# Patient Record
Sex: Male | Born: 1955 | Race: White | Hispanic: No | Smoking: Never smoker
Health system: Southern US, Community
[De-identification: ages and names within clinical notes are randomized; demographics above are authoritative.]

## PROBLEM LIST (undated history)

## (undated) DIAGNOSIS — I2510 Atherosclerotic heart disease of native coronary artery without angina pectoris: Secondary | ICD-10-CM

## (undated) DIAGNOSIS — F419 Anxiety disorder, unspecified: Secondary | ICD-10-CM

## (undated) DIAGNOSIS — Z8669 Personal history of other diseases of the nervous system and sense organs: Secondary | ICD-10-CM

## (undated) DIAGNOSIS — R011 Cardiac murmur, unspecified: Secondary | ICD-10-CM

## (undated) DIAGNOSIS — I499 Cardiac arrhythmia, unspecified: Secondary | ICD-10-CM

## (undated) DIAGNOSIS — G43909 Migraine, unspecified, not intractable, without status migrainosus: Secondary | ICD-10-CM

## (undated) DIAGNOSIS — K449 Diaphragmatic hernia without obstruction or gangrene: Secondary | ICD-10-CM

## (undated) DIAGNOSIS — M1990 Unspecified osteoarthritis, unspecified site: Secondary | ICD-10-CM

## (undated) DIAGNOSIS — I10 Essential (primary) hypertension: Secondary | ICD-10-CM

## (undated) HISTORY — PX: GANGLION CYST EXCISION: SHX1691

## (undated) HISTORY — DX: Essential (primary) hypertension: I10

## (undated) HISTORY — DX: Cardiac murmur, unspecified: R01.1

## (undated) HISTORY — DX: Personal history of other diseases of the nervous system and sense organs: Z86.69

## (undated) HISTORY — PX: COLONOSCOPY: SHX174

## (undated) HISTORY — DX: Diaphragmatic hernia without obstruction or gangrene: K44.9

## (undated) HISTORY — DX: Migraine, unspecified, not intractable, without status migrainosus: G43.909

---

## 2004-09-14 HISTORY — PX: OTHER SURGICAL HISTORY: SHX169

## 2004-09-14 LAB — HM COLONOSCOPY: HM Colonoscopy: NORMAL

## 2008-09-02 ENCOUNTER — Emergency Department (HOSPITAL_COMMUNITY): Admission: EM | Admit: 2008-09-02 | Discharge: 2008-09-02 | Payer: Self-pay | Admitting: Emergency Medicine

## 2009-04-30 ENCOUNTER — Ambulatory Visit: Payer: Self-pay | Admitting: Family Medicine

## 2009-05-27 ENCOUNTER — Ambulatory Visit: Payer: Self-pay | Admitting: Family Medicine

## 2010-03-13 ENCOUNTER — Ambulatory Visit: Payer: Self-pay | Admitting: Family Medicine

## 2010-06-30 ENCOUNTER — Ambulatory Visit: Payer: Self-pay | Admitting: Family Medicine

## 2010-07-24 ENCOUNTER — Ambulatory Visit: Payer: Self-pay | Admitting: Family Medicine

## 2010-08-22 ENCOUNTER — Ambulatory Visit: Payer: Self-pay | Admitting: Family Medicine

## 2011-01-20 ENCOUNTER — Other Ambulatory Visit: Payer: Self-pay | Admitting: Family Medicine

## 2011-01-20 MED ORDER — BUPROPION HCL ER (XL) 150 MG PO TB24: 150.0000 mg | ORAL_TABLET | ORAL | Status: DC

## 2011-04-02 ENCOUNTER — Encounter: Payer: Self-pay | Admitting: Family Medicine

## 2011-04-08 ENCOUNTER — Other Ambulatory Visit: Payer: Self-pay

## 2011-04-08 MED ORDER — MOEXIPRIL-HYDROCHLOROTHIAZIDE 7.5-12.5 MG PO TABS: 1.0000 | ORAL_TABLET | Freq: Every day | ORAL | Status: DC

## 2011-05-03 ENCOUNTER — Other Ambulatory Visit: Payer: Self-pay | Admitting: Family Medicine

## 2011-05-08 ENCOUNTER — Telehealth: Payer: Self-pay | Admitting: Family Medicine

## 2011-05-08 NOTE — Telephone Encounter (Signed)
LEFT MESSAGE

## 2011-05-15 ENCOUNTER — Ambulatory Visit (INDEPENDENT_AMBULATORY_CARE_PROVIDER_SITE_OTHER): Payer: BC Managed Care – PPO | Admitting: Medical

## 2011-05-15 ENCOUNTER — Encounter: Payer: Self-pay | Admitting: Medical

## 2011-05-15 VITALS — BP 122/88 | HR 80 | Temp 98.4°F | Resp 20 | Ht 67.0 in | Wt 177.0 lb

## 2011-05-15 DIAGNOSIS — R05 Cough: Secondary | ICD-10-CM

## 2011-05-15 DIAGNOSIS — J029 Acute pharyngitis, unspecified: Secondary | ICD-10-CM

## 2011-05-15 MED ORDER — AMOXICILLIN 875 MG PO TABS: 875.0000 mg | ORAL_TABLET | Freq: Two times a day (BID) | ORAL | Status: AC

## 2011-05-15 NOTE — Progress Notes (Signed)
Subjective:     Rodney Wade is a 55 y.o. male who presents for evaluation of sore throat. Associated symptoms include dry cough, sinus and nasal congestion and sore throat. Onset of symptoms was 2 days ago, and have been gradually worsening since that time. He is drinking plenty of fluids. He has not had a recent close exposure to someone with proven streptococcal pharyngitis. He notes in the past that he gets strep 2-3 times yearly, occasionally a bronchitis, and amoxicillin usually helps.  He thinks he needs an antibiotic.  Will be on vacation next week and really wants this cleared up.   The following portions of the patient's history were reviewed and updated as appropriate: allergies, current medications, past family history, past medical history, past social history, past surgical history and problem list.  Past Medical History  Diagnosis Date  . Hypertension   . Gout   . HH (hiatus hernia)   . History of benign essential tremor   . Migraine headache     Review of Systems Constitutional: denies fever, chills, sweats Allergy: denies recent sneezing, itching Dermatology: denies rash ENT: no runny nose, ear pain, sinus pain, teeth pain Cardiology: denies chest pain, palpitations Respiratory: denies shortness of breath, wheezing,  Gastroenterology: denies abdominal pain, nausea, vomiting, diarrhea Musculoskeletal: denies arthralgias, myalgias, joint swelling, back pain, neck pain Ophthalmology: denies eye redness, itching, discharge    Objective:      Filed Vitals:   05/15/11 1345  BP: 122/88  Pulse: 80  Temp: 98.4 F (36.9 C)  Resp: 20    General appearance: no distress, WD/WN, mildly ill-appearing HEENT: normocephalic, conjunctiva/corneas normal, sclerae anicteric, nares patent, no discharge or erythema, pharynx with erythema, no exudate.  Oral cavity: MMM, no lesions  Neck: supple, no lymphadenopathy, no thyromegaly Heart: RRR, normal S1, S2, no  murmurs Lungs: CTA bilaterally, no wheezes, rhonchi, or rales  Laboratory Strep test done. Results:negative.    Assessment:   Encounter Diagnoses  Name Primary?  . Pharyngitis Yes  . Cough      Plan:    Advised that symptoms and exam suggest a viral etiology.  Discussed symptomatic treatment including salt water gargles, warm fluids, rest, hydrate well, can use over-the-counter Tylenol for throat pain, fever, or malaise.  He does have a cough.  Discussed signs of sinusitis, bronchitis, or worsening infection.  Advised if fever over 101 or much worse over weekend, gave script for Amoxicillin.  Given his hx/o prior multi streps per year, will send for throat culture.  If worse or not improving within 2-3 days, call or return.

## 2011-05-17 LAB — CULTURE, GROUP A STREP: Organism ID, Bacteria: NORMAL

## 2011-05-19 ENCOUNTER — Telehealth: Payer: Self-pay | Admitting: *Deleted

## 2011-05-19 NOTE — Telephone Encounter (Addendum)
Message copied by Dorthula Perfect on Tue May 19, 2011  8:05 AM ------      Message from: Jac Canavan      Created: Tue May 19, 2011  6:08 AM       Throat culture normal.  See if he is doing better.  Pt notified of throat culture.  Pt stated that he is doing better.  CM, LPN

## 2011-06-13 ENCOUNTER — Other Ambulatory Visit: Payer: Self-pay | Admitting: Family Medicine

## 2011-06-15 NOTE — Telephone Encounter (Signed)
Is this ok?

## 2011-09-13 ENCOUNTER — Other Ambulatory Visit: Payer: Self-pay | Admitting: Family Medicine

## 2011-09-14 NOTE — Telephone Encounter (Signed)
Is this ok to refill?  

## 2011-09-15 NOTE — Telephone Encounter (Signed)
He needs an appt 

## 2011-09-16 NOTE — Telephone Encounter (Signed)
Called and no answer.

## 2011-09-18 NOTE — Telephone Encounter (Signed)
Called no answer

## 2011-09-21 ENCOUNTER — Telehealth: Payer: Self-pay | Admitting: Internal Medicine

## 2011-09-21 ENCOUNTER — Encounter: Payer: Self-pay | Admitting: Family Medicine

## 2011-09-21 ENCOUNTER — Ambulatory Visit (INDEPENDENT_AMBULATORY_CARE_PROVIDER_SITE_OTHER): Payer: BC Managed Care – PPO | Admitting: Family Medicine

## 2011-09-21 VITALS — BP 116/76 | HR 85 | Ht 67.0 in | Wt 186.0 lb

## 2011-09-21 DIAGNOSIS — B351 Tinea unguium: Secondary | ICD-10-CM

## 2011-09-21 DIAGNOSIS — Z8739 Personal history of other diseases of the musculoskeletal system and connective tissue: Secondary | ICD-10-CM

## 2011-09-21 DIAGNOSIS — Z862 Personal history of diseases of the blood and blood-forming organs and certain disorders involving the immune mechanism: Secondary | ICD-10-CM

## 2011-09-21 DIAGNOSIS — F341 Dysthymic disorder: Secondary | ICD-10-CM

## 2011-09-21 DIAGNOSIS — I10 Essential (primary) hypertension: Secondary | ICD-10-CM

## 2011-09-21 DIAGNOSIS — Z23 Encounter for immunization: Secondary | ICD-10-CM

## 2011-09-21 DIAGNOSIS — Z8669 Personal history of other diseases of the nervous system and sense organs: Secondary | ICD-10-CM | POA: Insufficient documentation

## 2011-09-21 DIAGNOSIS — Z0000 Encounter for general adult medical examination without abnormal findings: Secondary | ICD-10-CM

## 2011-09-21 LAB — COMPREHENSIVE METABOLIC PANEL
AST: 23 U/L (ref 0–37)
Albumin: 4.6 g/dL (ref 3.5–5.2)
Alkaline Phosphatase: 43 U/L (ref 39–117)
Glucose, Bld: 93 mg/dL (ref 70–99)
Potassium: 4.6 mEq/L (ref 3.5–5.3)
Sodium: 142 mEq/L (ref 135–145)
Total Protein: 7 g/dL (ref 6.0–8.3)

## 2011-09-21 LAB — LIPID PANEL: LDL Cholesterol: 102 mg/dL — ABNORMAL HIGH (ref 0–99)

## 2011-09-21 MED ORDER — MOEXIPRIL-HYDROCHLOROTHIAZIDE 7.5-12.5 MG PO TABS: 1.0000 | ORAL_TABLET | Freq: Every day | ORAL | Status: DC

## 2011-09-21 MED ORDER — BUPROPION HCL ER (XL) 300 MG PO TB24: 300.0000 mg | ORAL_TABLET | Freq: Every day | ORAL | Status: DC

## 2011-09-21 MED ORDER — TERBINAFINE HCL 250 MG PO TABS: 250.0000 mg | ORAL_TABLET | Freq: Every day | ORAL | Status: AC

## 2011-09-21 MED ORDER — TERBINAFINE HCL 250 MG PO TABS: 250.0000 mg | ORAL_TABLET | Freq: Every day | ORAL | Status: DC

## 2011-09-21 NOTE — Telephone Encounter (Signed)
Sent new rx to [facility]

## 2011-09-21 NOTE — Progress Notes (Signed)
Subjective:    Patient ID: Rodney Wade, male    DOB: Jun 02, 1956, 56 y.o.   MRN: [id number]  HPI He is here for complete examination. He continues on Wellbutrin and is not sure whether it is working. He is ambivalent as to whether to stop it or possibly increase it. His work remains the main stressor in his life. He is single and presently not dating anyone. Would like medication for the fungal infection in his toes. He has not had a migraine headache in 2 years. He has not had a gout attack in quite some time.   Review of Systems  Constitutional: Negative.   HENT: Negative.   Eyes: Negative.   Respiratory: Negative.   Cardiovascular: Negative.   Gastrointestinal: Negative.   Genitourinary: Negative.   Musculoskeletal: Negative.   Skin: Negative.   Neurological: Negative.   Psychiatric/Behavioral: Positive for dysphoric mood.       Objective:   Physical Exam BP 116/76  Pulse 85  Ht 5\' 7"  (1.702 m)  Wt 186 lb (84.369 kg)  BMI 29.13 kg/m2  General Appearance:    Alert, cooperative, no distress, appears stated age  Head:    Normocephalic, without obvious abnormality, atraumatic  Eyes:    PERRL, conjunctiva/corneas clear, EOM's intact, fundi    benign  Ears:    Normal TM's and external ear canals  Nose:   Nares normal, mucosa normal, no drainage or sinus   tenderness  Throat:   Lips, mucosa, and tongue normal; teeth and gums normal  Neck:   Supple, no lymphadenopathy;  thyroid:  no   enlargement/tenderness/nodules; no carotid   bruit or JVD  Back:    Spine nontender, no curvature, ROM normal, no CVA     tenderness  Lungs:     Clear to auscultation bilaterally without wheezes, rales or     ronchi; respirations unlabored  Chest Wall:    No tenderness or deformity   Heart:    Regular rate and rhythm, S1 and S2 normal, no murmur, rub   or gallop  Breast Exam:    No chest wall tenderness, masses or gynecomastia  Abdomen:     Soft, non-tender, nondistended, normoactive  bowel sounds,    no masses, no hepatosplenomegaly  Genitalia:    Normal male external genitalia without lesions.  Testicles without masses.  No inguinal hernias.  Rectal:    Normal sphincter tone, no masses or tenderness; guaiac negative stool.  Prostate smooth, no nodules, not enlarged.  Extremities:   No clubbing, cyanosis or edema.thinning of several of his toenails is noted.   Pulses:   2+ and symmetric all extremities  Skin:   Skin color, texture, turgor normal, no rashes or lesions  Lymph nodes:   Cervical, supraclavicular, and axillary nodes normal  Neurologic:   CNII-XII intact, normal strength, sensation and gait; reflexes 2+ and symmetric throughout          Psych:   Normal mood, affect, hygiene and grooming.           Assessment & Plan:   1. Tinea unguium    2. Hypertension    3. Dysthymia    4. Routine general medical examination at a health care facility  CBC with Differential, Comprehensive metabolic panel, Lipid panel  5. History of gout    6. History of migraine headaches     he will continue on his present medications. Lamisil was called in. He'll return here in 2 months for repeat blood work. I  will also increase his Wellbutrin to 300 mg per day and see if this will help with the symptoms. He sought also given

## 2011-09-21 NOTE — Patient Instructions (Signed)
You will need to come back in several months for repeat blood work to followup on the Lamisil. Also let me know how the increased dose of Wellbutrin is working.

## 2011-09-22 LAB — CBC WITH DIFFERENTIAL/PLATELET
Basophils Absolute: 0 10*3/uL (ref 0.0–0.1)
Basophils Relative: 1 % (ref 0–1)
Hemoglobin: 15.2 g/dL (ref 13.0–17.0)
Lymphocytes Relative: 39 % (ref 12–46)
MCHC: 33 g/dL (ref 30.0–36.0)
Monocytes Relative: 12 % (ref 3–12)
Neutro Abs: 1.9 10*3/uL (ref 1.7–7.7)
Neutrophils Relative %: 39 % — ABNORMAL LOW (ref 43–77)
WBC: 4.9 10*3/uL (ref 4.0–10.5)

## 2011-10-20 ENCOUNTER — Ambulatory Visit (INDEPENDENT_AMBULATORY_CARE_PROVIDER_SITE_OTHER): Payer: BC Managed Care – PPO | Admitting: Family Medicine

## 2011-10-20 ENCOUNTER — Encounter: Payer: Self-pay | Admitting: Family Medicine

## 2011-10-20 VITALS — BP 140/78 | HR 89 | Temp 98.0°F | Ht 66.0 in | Wt 182.0 lb

## 2011-10-20 DIAGNOSIS — J111 Influenza due to unidentified influenza virus with other respiratory manifestations: Secondary | ICD-10-CM

## 2011-10-20 MED ORDER — CLARITHROMYCIN 500 MG PO TABS: 500.0000 mg | ORAL_TABLET | Freq: Two times a day (BID) | ORAL | Status: AC

## 2011-10-20 NOTE — Patient Instructions (Signed)
NyQuil can help with coughing at night and during the day he can use Robitussin-DM or Delsym

## 2011-10-20 NOTE — Progress Notes (Signed)
  Subjective:    Patient ID: Rodney Wade, male    DOB: 01-Aug-1956, 56 y.o.   MRN: [id number]  HPI Proximal pulley 2 weeks ago he had the onset of fever, nausea, vomiting and chills. This cleared fairly quickly and then he developed a dry hacking cough that has been intermittent since then. No fever, chills or earache slight sore throat. He continues on medications listed in the chart. He does not smoke.  Review of Systems     Objective:   Physical Exam alert and in no distress. Tympanic membranes and canals are normal. Throat is clear. Tonsils are normal. Neck is supple without adenopathy or thyromegaly. Cardiac exam shows a regular sinus rhythm without murmurs or gallops. Lungs are clear to auscultation.        Assessment & Plan:   1. Bronchitis with flu    symptoms sound atypical and I will therefore give him Biaxin. He is to call if not entirely better.

## 2011-11-19 ENCOUNTER — Encounter: Payer: Self-pay | Admitting: Family Medicine

## 2011-11-19 ENCOUNTER — Ambulatory Visit (INDEPENDENT_AMBULATORY_CARE_PROVIDER_SITE_OTHER): Payer: BC Managed Care – PPO | Admitting: Family Medicine

## 2011-11-19 VITALS — BP 126/80 | HR 83 | Wt 186.0 lb

## 2011-11-19 DIAGNOSIS — B351 Tinea unguium: Secondary | ICD-10-CM

## 2011-11-19 DIAGNOSIS — Z79899 Other long term (current) drug therapy: Secondary | ICD-10-CM

## 2011-11-19 DIAGNOSIS — F341 Dysthymic disorder: Secondary | ICD-10-CM

## 2011-11-19 NOTE — Progress Notes (Signed)
  Subjective:    Patient ID: Rodney Wade, male    DOB: Dec 05, 1955, 56 y.o.   MRN: [id number]  HPI Since last being seen, he cut his well being turned on 150 mg. 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 He would like to stay on this medication and possibly stop this summer. He states that the work and personal stress have diminished. He has continued on the Lamisil and is having no difficulty with this.   Review of Systems     Objective:   Physical Exam Alert and in no distress. Exam of his toes do show improvement at the base of the nails.       Assessment & Plan:   1. Onychomycosis  Comprehensive metabolic panel  2. Dysthymia    3. Encounter for long-term (current) use of other medications  Comprehensive metabolic panel   He will continue on Wellbutrin and possibly stop at this summer. Continue on Lamisil and call in several months if any questions.

## 2011-11-19 NOTE — Patient Instructions (Signed)
Come on back in about 4 months and we will reevaluate your toenail fungal infection

## 2011-11-20 ENCOUNTER — Telehealth: Payer: Self-pay

## 2011-11-20 LAB — COMPREHENSIVE METABOLIC PANEL
ALT: 22 U/L (ref 0–53)
CO2: 25 mEq/L (ref 19–32)
Calcium: 9.8 mg/dL (ref 8.4–10.5)
Chloride: 101 mEq/L (ref 96–112)
Sodium: 139 mEq/L (ref 135–145)
Total Protein: 6.9 g/dL (ref 6.0–8.3)

## 2011-11-20 NOTE — Progress Notes (Signed)
Quick Note:  The blood work is normal ______ 

## 2011-11-20 NOTE — Telephone Encounter (Signed)
Pt informed of labs

## 2011-12-04 ENCOUNTER — Telehealth: Payer: Self-pay | Admitting: Internal Medicine

## 2011-12-04 MED ORDER — BUPROPION HCL ER (XL) 300 MG PO TB24: 300.0000 mg | ORAL_TABLET | Freq: Every day | ORAL | Status: DC

## 2011-12-04 NOTE — Telephone Encounter (Signed)
Wellbutrin called in

## 2011-12-04 NOTE — Telephone Encounter (Signed)
IS THIS OK 

## 2012-03-25 ENCOUNTER — Ambulatory Visit: Payer: BC Managed Care – PPO | Admitting: Family Medicine

## 2012-04-04 ENCOUNTER — Encounter: Payer: Self-pay | Admitting: Family Medicine

## 2012-04-04 ENCOUNTER — Ambulatory Visit (INDEPENDENT_AMBULATORY_CARE_PROVIDER_SITE_OTHER): Payer: BC Managed Care – PPO | Admitting: Family Medicine

## 2012-04-04 VITALS — BP 126/80 | HR 90 | Wt 197.0 lb

## 2012-04-04 DIAGNOSIS — E669 Obesity, unspecified: Secondary | ICD-10-CM | POA: Insufficient documentation

## 2012-04-04 DIAGNOSIS — F341 Dysthymic disorder: Secondary | ICD-10-CM

## 2012-04-04 DIAGNOSIS — I10 Essential (primary) hypertension: Secondary | ICD-10-CM

## 2012-04-04 NOTE — Progress Notes (Signed)
  Subjective:    Patient ID: Rodney Wade, male    DOB: 1956-02-05, 56 y.o.   MRN: [id number]  HPI He is here for a recheck. He did stop his Wellbutrin stating that he was doing very well and did not think he needed it. He tapered off the medication and has been off for several months. The stresses that he is under from work are relatively stable. 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 He also has concerns over his weight and asked about weight loss pills. Continues on the blood pressure medication. He does keep himself active socially.   Review of Systems     Objective:   Physical Exam Alert and in no distress with appropriate affect otherwise not examined       Assessment & Plan:   1. Dysthymia   2. Hypertension   3. Obesity (BMI 30-39.9)    encouraged him to followup on the potential new job. We discussed weight loss. Recommended he try to get down to a waist size of 32-34. Discussed briefly the use of pills and recommended against this.

## 2012-04-30 ENCOUNTER — Ambulatory Visit (INDEPENDENT_AMBULATORY_CARE_PROVIDER_SITE_OTHER): Payer: BC Managed Care – PPO | Admitting: Internal Medicine

## 2012-04-30 VITALS — BP 115/85 | HR 78 | Temp 98.0°F | Resp 17 | Ht 67.0 in | Wt 189.0 lb

## 2012-04-30 DIAGNOSIS — S1093XA Contusion of unspecified part of neck, initial encounter: Secondary | ICD-10-CM

## 2012-04-30 DIAGNOSIS — S0093XA Contusion of unspecified part of head, initial encounter: Secondary | ICD-10-CM

## 2012-04-30 DIAGNOSIS — S0100XA Unspecified open wound of scalp, initial encounter: Secondary | ICD-10-CM

## 2012-04-30 NOTE — Progress Notes (Signed)
  Subjective:    Patient ID: Rodney Wade, male    DOB: Jul 17, 1956, 56 y.o.   MRN: [id number]  HPI [place] , hit scalp on roof, scraped skin of and needs repair. TD utd No loc, does have ha but mild.   Review of Systems     Objective:   Physical Exam Wound on scalp 2x3 cm Neuro intact. Will repair with Ms. Dolphus Jenny      Assessment & Plan:  Wound and head care

## 2012-04-30 NOTE — Patient Instructions (Signed)
Head Injury, Adult You have had a head injury that does not appear serious at this time. A concussion is a state of changed mental ability, usually from a blow to the head. You should take clear liquids for the rest of the day and then resume your regular diet. You should not take sedatives or alcoholic beverages for as long as directed by your caregiver after discharge. After injuries such as yours, most problems occur within the first 24 hours. SYMPTOMS These minor symptoms may be experienced after discharge:  Memory difficulties.   Dizziness.   Headaches.   Double vision.   Hearing difficulties.   Depression.   Tiredness.   Weakness.   Difficulty with concentration.  If you experience any of these problems, you should not be alarmed. A concussion requires a few days for recovery. Many patients with head injuries frequently experience such symptoms. Usually, these problems disappear without medical care. If symptoms last for more than one day, notify your caregiver. See your caregiver sooner if symptoms are becoming worse rather than better. HOME CARE INSTRUCTIONS   During the next 24 hours you must stay with someone who can watch you for the warning signs listed below.  Although it is unlikely that serious side effects will occur, you should be aware of signs and symptoms which may necessitate your return to this location. Side effects may occur up to 7 - 10 days following the injury. It is important for you to carefully monitor your condition and contact your caregiver or seek immediate medical attention if there is a change in your condition. SEEK IMMEDIATE MEDICAL CARE IF:   There is confusion or drowsiness.   You can not awaken the injured person.   There is nausea (feeling sick to your stomach) or continued, forceful vomiting.   You notice dizziness or unsteadiness which is getting worse, or inability to walk.   You have convulsions or unconsciousness.   You experience  severe, persistent headaches not relieved by over-the-counter or prescription medicines for pain. (Do not take aspirin as this impairs clotting abilities). Take other pain medications only as directed.   You can not use arms or legs normally.   There is clear or bloody discharge from the nose or ears.  MAKE SURE YOU:   Understand these instructions.   Will watch your condition.   Will get help right away if you are not doing well or get worse.  Document Released: 08/31/2005 Document Revised: 08/20/2011 Document Reviewed: 07/19/2009 [facility] Patient Information 2012 [place], [place].Wound Care Wound care helps prevent pain and infection.  You may need a tetanus shot if:  You cannot remember when you had your last tetanus shot.   You have never had a tetanus shot.   The injury broke your skin.  If you need a tetanus shot and you choose not to have one, you may get tetanus. Sickness from tetanus can be serious. HOME CARE   Only take medicine as told by your doctor.   Clean the wound daily with mild soap and water.   Change any bandages (dressings) as told by your doctor.   Put medicated cream and a bandage on the wound as told by your doctor.   Change the bandage if it gets wet, dirty, or starts to smell.   Take showers. Do not take baths, swim, or do anything that puts your wound under water.   Rest and raise (elevate) the wound until the pain and puffiness (swelling) are better.  Keep all doctor visits as told.  GET HELP RIGHT AWAY IF:   Yellowish-white fluid (pus) comes from the wound.   Medicine does not lessen your pain.   There is a red streak going away from the wound.   You cannot move your finger or toe.   You have a fever.  MAKE SURE YOU:   Understand these instructions.   Will watch your condition.   Will get help right away if you are not doing well or get worse.  Document Released: 06/09/2008 Document Revised: 08/20/2011 Document Reviewed:  01/04/2011 [facility] Patient Information 2012 [place], [place].

## 2012-04-30 NOTE — Progress Notes (Signed)
  Subjective:    Patient ID: Rodney Wade, male    DOB: 1955-10-19, 56 y.o.   MRN: [id number]  HPI    Review of Systems     Objective:   Physical Exam  Cleansed with betadine and sterile water. Debrided exposed dermis.  Remaining epidermis-debrided shallow edges and remaining vascularized epidermis is displayed across the wound. Xeroform placed over entire wound. Bandaged.       Assessment & Plan:  See me after 5pm Tuesday Wound care h/o

## 2012-05-03 ENCOUNTER — Encounter: Payer: Self-pay | Admitting: Physician Assistant

## 2012-05-03 ENCOUNTER — Ambulatory Visit (INDEPENDENT_AMBULATORY_CARE_PROVIDER_SITE_OTHER): Payer: BC Managed Care – PPO | Admitting: Physician Assistant

## 2012-05-03 VITALS — HR 83 | Temp 98.5°F | Resp 17 | Ht 67.0 in | Wt 193.0 lb

## 2012-05-03 DIAGNOSIS — S0190XA Unspecified open wound of unspecified part of head, initial encounter: Secondary | ICD-10-CM

## 2012-05-03 NOTE — Progress Notes (Signed)
  Subjective:    Patient ID: Rodney Wade, male    DOB: 01-25-1956, 56 y.o.   MRN: [id number]  HPI Here for scalp recheck. Doing well.  A little tender.  Review of Systems  All other systems reviewed and are negative.       Objective:   Physical Exam  Nursing note and vitals reviewed. Constitutional: He is oriented to person, place, and time. He appears well-developed and well-nourished.  Neurological: He is alert and oriented to person, place, and time.  Skin:       Scalp-xeroform is in place.  Scabbing beneath.  No sign of infection.          Assessment & Plan:  Wound-head-continue with wound care.  Soak xeroform off in 2 weeks if it is still in place.

## 2012-05-14 ENCOUNTER — Other Ambulatory Visit: Payer: Self-pay | Admitting: Family Medicine

## 2012-11-22 ENCOUNTER — Other Ambulatory Visit: Payer: Self-pay | Admitting: Family Medicine

## 2012-12-13 ENCOUNTER — Ambulatory Visit (INDEPENDENT_AMBULATORY_CARE_PROVIDER_SITE_OTHER): Payer: BC Managed Care – PPO | Admitting: Family Medicine

## 2012-12-13 VITALS — BP 130/76 | HR 86 | Temp 98.2°F

## 2012-12-13 DIAGNOSIS — M6580 Other synovitis and tenosynovitis, unspecified site: Secondary | ICD-10-CM

## 2012-12-13 DIAGNOSIS — M659 Synovitis and tenosynovitis, unspecified: Secondary | ICD-10-CM

## 2012-12-13 NOTE — Patient Instructions (Signed)
Take 4 Advil 3 times per day regularly to help with this pain. If the symptoms get worse, he start running a fever, call me

## 2012-12-13 NOTE — Progress Notes (Signed)
  Subjective:    Patient ID: Rodney Wade, male    DOB: 29-Jan-1956, 57 y.o.   MRN: [id number]  HPI He woke up early this morning complaining of fever, chills, myalgias. He took one Advil and went back to bed. When he woke up again his fever and chills had diminished however he is complaining of left hip pain. Denies sore throat, earache, cough or congestion. He does complain of left hip knee and ankle discomfort. No other joints are involved. He has had no recent STD exposure or recent injuries   Review of Systems     Objective:   Physical Exam alert and in no distress. Tympanic membranes and canals are normal. Throat is clear. Tonsils are normal. Neck is supple without adenopathy or thyromegaly. Cardiac exam shows a regular sinus rhythm without murmurs or gallops. Lungs are clear to auscultation. Pain on motion of the hip in all directions. No tenderness to palpation over the greater trochanter. No swelling of the fingers, wrists, elbows, knees or ankles is noted.       Assessment & Plan:  Synovitis of hip there really isn't any evidence of systemic infection or septic joint. I will treat conservatively with any NSAID and he will keep in touch with me if his symptoms worsen.

## 2012-12-27 ENCOUNTER — Ambulatory Visit (INDEPENDENT_AMBULATORY_CARE_PROVIDER_SITE_OTHER): Payer: BC Managed Care – PPO | Admitting: Family Medicine

## 2012-12-27 ENCOUNTER — Encounter: Payer: Self-pay | Admitting: Family Medicine

## 2012-12-27 VITALS — BP 120/78 | HR 72 | Ht 67.0 in | Wt 195.0 lb

## 2012-12-27 DIAGNOSIS — Z8639 Personal history of other endocrine, nutritional and metabolic disease: Secondary | ICD-10-CM

## 2012-12-27 DIAGNOSIS — Z0000 Encounter for general adult medical examination without abnormal findings: Secondary | ICD-10-CM

## 2012-12-27 DIAGNOSIS — Z8739 Personal history of other diseases of the musculoskeletal system and connective tissue: Secondary | ICD-10-CM

## 2012-12-27 DIAGNOSIS — I10 Essential (primary) hypertension: Secondary | ICD-10-CM

## 2012-12-27 DIAGNOSIS — E669 Obesity, unspecified: Secondary | ICD-10-CM

## 2012-12-27 DIAGNOSIS — F341 Dysthymic disorder: Secondary | ICD-10-CM

## 2012-12-27 DIAGNOSIS — G4484 Primary exertional headache: Secondary | ICD-10-CM

## 2012-12-27 DIAGNOSIS — R51 Headache: Secondary | ICD-10-CM

## 2012-12-27 DIAGNOSIS — Z8669 Personal history of other diseases of the nervous system and sense organs: Secondary | ICD-10-CM

## 2012-12-27 LAB — HEMOCCULT GUIAC POC 1CARD (OFFICE)

## 2012-12-27 LAB — CBC WITH DIFFERENTIAL/PLATELET
Eosinophils Relative: 5 % (ref 0–5)
HCT: 43.2 % (ref 39.0–52.0)
Lymphocytes Relative: 42 % (ref 12–46)
Lymphs Abs: 2.2 10*3/uL (ref 0.7–4.0)
MCV: 89.3 fL (ref 78.0–100.0)
Monocytes Absolute: 0.6 10*3/uL (ref 0.1–1.0)
Platelets: 248 10*3/uL (ref 150–400)
RBC: 4.84 MIL/uL (ref 4.22–5.81)
WBC: 5.3 10*3/uL (ref 4.0–10.5)

## 2012-12-27 LAB — COMPREHENSIVE METABOLIC PANEL
ALT: 17 U/L (ref 0–53)
Albumin: 4.6 g/dL (ref 3.5–5.2)
CO2: 25 mEq/L (ref 19–32)
Calcium: 9.6 mg/dL (ref 8.4–10.5)
Chloride: 105 mEq/L (ref 96–112)
Creat: 0.88 mg/dL (ref 0.50–1.35)
Potassium: 4.1 mEq/L (ref 3.5–5.3)
Total Protein: 6.8 g/dL (ref 6.0–8.3)

## 2012-12-27 MED ORDER — MOEXIPRIL-HYDROCHLOROTHIAZIDE 7.5-12.5 MG PO TABS: ORAL_TABLET | ORAL | Status: DC

## 2012-12-27 NOTE — Progress Notes (Signed)
Subjective:    Patient ID: Rodney Wade, male    DOB: Nov 29, 1955, 57 y.o.   MRN: [id number]  HPI He is here for complete exam. He was seen recently and treated for hip pain with NSAID's. The pain did resolve. He also has a history of gout but has not had an attack in 10 years .He is on no medication for this. He also has history of migraine headaches and takes Tylenol for this. They usually occur twice per year.He also complains of headache with exertion and has concerns over this.He continues on his blood pressure medication without difficulty. He has a previous history of dysthymia however is doing quite well and has no concerns psychologically. He also complains of difficulty with right great toe discomfort. He has a history of bone spur with removal several years ago.Social and family history were reviewed. His work continues to go well.   Review of Systems  Constitutional: Negative.   HENT: Negative.   Eyes: Negative.   Respiratory: Negative.   Cardiovascular: Negative.   Gastrointestinal: Negative.   Endocrine: Negative.   Genitourinary: Negative.   Musculoskeletal: Negative.   Skin: Negative.   Allergic/Immunologic: Negative.   Neurological: Negative.   Hematological: Negative.   Psychiatric/Behavioral: Negative.        Objective:   Physical Exam BP 120/78  Pulse 72  Ht 5\' 7"  (1.702 m)  Wt 195 lb (88.451 kg)  BMI 30.53 kg/m2  General Appearance:    Alert, cooperative, no distress, appears stated age  Head:    Normocephalic, without obvious abnormality, atraumatic  Eyes:    PERRL, conjunctiva/corneas clear, EOM's intact, fundi    benign  Ears:    Normal TM's and external ear canals  Nose:   Nares normal, mucosa normal, no drainage or sinus   tenderness  Throat:   Lips, mucosa, and tongue normal; teeth and gums normal  Neck:   Supple, no lymphadenopathy;  thyroid:  no   enlargement/tenderness/nodules; no carotid   bruit or JVD  Back:    Spine nontender, no  curvature, ROM normal, no CVA     tenderness  Lungs:     Clear to auscultation bilaterally without wheezes, rales or     ronchi; respirations unlabored  Chest Wall:    No tenderness or deformity   Heart:    Regular rate and rhythm, S1 and S2 normal, no murmur, rub   or gallop  Breast Exam:    No chest wall tenderness, masses or gynecomastia  Abdomen:     Soft, non-tender, nondistended, normoactive bowel sounds,    no masses, no hepatosplenomegaly  Genitalia:  deferred  Rectal:    Normal sphincter tone, no masses or tenderness; guaiac negative stool.  Prostate smooth, no nodules, not enlarged.  Extremities:   No clubbing, cyanosis or edema  Pulses:   2+ and symmetric all extremities  Skin:   Skin color, texture, turgor normal, no rashes or lesions  Lymph nodes:   Cervical, supraclavicular, and axillary nodes normal  Neurologic:   CNII-XII intact, normal strength, sensation and gait; reflexes 2+ and symmetric throughout          Psych:   Normal mood, affect, hygiene and grooming.           Assessment & Plan:  Hypertension - Plan: CBC with Differential, Comprehensive metabolic panel, moexipril-hydrochlorothiazide (UNIRETIC) 7.5-12.5 MG per tablet  Dysthymia  History of gout  History of migraine headaches  Obesity (BMI 30-39.9)  Exertional headache  Routine general  medical examination at a health care facility - Plan: Lipid panel, CBC with Differential, Comprehensive metabolic panel, Hemoccult - 1 Card (office) discussed exertional headaches with him in detail. Recommend premedication with Tylenol or Advil. Informed him that this usually goes away with time. Continue present medication regimen. Encouraged him to get more physically active to help with weight reduction.

## 2012-12-28 NOTE — Progress Notes (Signed)
Quick Note:  PATIENT ADVISED LABS NORMAL HE VERBALIZED UNDERSTANDING ______

## 2012-12-28 NOTE — Progress Notes (Signed)
Quick Note:  The blood work is normal ______ 

## 2013-08-09 ENCOUNTER — Telehealth: Payer: Self-pay | Admitting: Family Medicine

## 2013-08-09 DIAGNOSIS — I10 Essential (primary) hypertension: Secondary | ICD-10-CM

## 2013-08-09 MED ORDER — LISINOPRIL-HYDROCHLOROTHIAZIDE 10-12.5 MG PO TABS: 1.0000 | ORAL_TABLET | Freq: Every day | ORAL | Status: DC

## 2013-08-09 NOTE — Telephone Encounter (Signed)
Please call concerning Uniretic Rx. He is out of meds and per [facility] it is on manufacturer back order. This has happened several times in the last 6 months. Patient is hoping you can give him a different Rx that will Be more available  [facility]   [address]   PT STATES THAT THIS [facility] IS CLOSING @ 5:00 TODAY, SO NEEDS RX SENT IN EARLY

## 2013-08-09 NOTE — Telephone Encounter (Signed)
He is having difficulty getting his present BP med renewed. I will switch him to lisinopril.

## 2013-08-09 NOTE — Telephone Encounter (Signed)
I called the patient and explained switching to a different medication.

## 2014-01-22 ENCOUNTER — Encounter: Payer: Self-pay | Admitting: Family Medicine

## 2014-02-15 ENCOUNTER — Encounter: Payer: Self-pay | Admitting: Family Medicine

## 2014-02-15 ENCOUNTER — Ambulatory Visit (INDEPENDENT_AMBULATORY_CARE_PROVIDER_SITE_OTHER): Payer: BC Managed Care – PPO | Admitting: Family Medicine

## 2014-02-15 VITALS — BP 100/72 | HR 60 | Ht 67.0 in | Wt 190.0 lb

## 2014-02-15 DIAGNOSIS — Z0000 Encounter for general adult medical examination without abnormal findings: Secondary | ICD-10-CM

## 2014-02-15 DIAGNOSIS — E669 Obesity, unspecified: Secondary | ICD-10-CM

## 2014-02-15 DIAGNOSIS — I10 Essential (primary) hypertension: Secondary | ICD-10-CM

## 2014-02-15 LAB — LIPID PANEL
Cholesterol: 175 mg/dL (ref 0–200)
HDL: 51 mg/dL (ref >39)
LDL CALC: 108 mg/dL — AB (ref 0–99)
TRIGLYCERIDES: 80 mg/dL (ref <150)
Total CHOL/HDL Ratio: 3.4 Ratio
VLDL: 16 mg/dL (ref 0–40)

## 2014-02-15 LAB — CBC WITH DIFFERENTIAL/PLATELET
BASOS ABS: 0.1 10*3/uL (ref 0.0–0.1)
BASOS PCT: 1 % (ref 0–1)
EOS ABS: 0.3 10*3/uL (ref 0.0–0.7)
Eosinophils Relative: 6 % — ABNORMAL HIGH (ref 0–5)
HCT: 42.2 % (ref 39.0–52.0)
Hemoglobin: 14.5 g/dL (ref 13.0–17.0)
Lymphocytes Relative: 37 % (ref 12–46)
Lymphs Abs: 2.1 10*3/uL (ref 0.7–4.0)
MCH: 31.1 pg (ref 26.0–34.0)
MCHC: 34.4 g/dL (ref 30.0–36.0)
MCV: 90.6 fL (ref 78.0–100.0)
MONOS PCT: 8 % (ref 3–12)
Monocytes Absolute: 0.5 10*3/uL (ref 0.1–1.0)
NEUTROS PCT: 48 % (ref 43–77)
Neutro Abs: 2.8 10*3/uL (ref 1.7–7.7)
PLATELETS: 235 10*3/uL (ref 150–400)
RBC: 4.66 MIL/uL (ref 4.22–5.81)
RDW: 14.5 % (ref 11.5–15.5)
WBC: 5.8 10*3/uL (ref 4.0–10.5)

## 2014-02-15 LAB — POCT URINALYSIS DIPSTICK
Bilirubin, UA: NEGATIVE
Glucose, UA: NEGATIVE
Ketones, UA: NEGATIVE
Leukocytes, UA: NEGATIVE
NITRITE UA: NEGATIVE
PH UA: 6
PROTEIN UA: NEGATIVE
RBC UA: NEGATIVE
Spec Grav, UA: 1.01
UROBILINOGEN UA: NEGATIVE

## 2014-02-15 LAB — COMPREHENSIVE METABOLIC PANEL
ALK PHOS: 40 U/L (ref 39–117)
ALT: 19 U/L (ref 0–53)
AST: 20 U/L (ref 0–37)
Albumin: 4.4 g/dL (ref 3.5–5.2)
BILIRUBIN TOTAL: 0.5 mg/dL (ref 0.2–1.2)
BUN: 15 mg/dL (ref 6–23)
CO2: 29 mEq/L (ref 19–32)
Calcium: 9.4 mg/dL (ref 8.4–10.5)
Chloride: 102 mEq/L (ref 96–112)
Creat: 0.81 mg/dL (ref 0.50–1.35)
GLUCOSE: 95 mg/dL (ref 70–99)
Potassium: 4.3 mEq/L (ref 3.5–5.3)
SODIUM: 137 meq/L (ref 135–145)
TOTAL PROTEIN: 7 g/dL (ref 6.0–8.3)

## 2014-02-15 MED ORDER — LISINOPRIL-HYDROCHLOROTHIAZIDE 10-12.5 MG PO TABS: 1.0000 | ORAL_TABLET | Freq: Every day | ORAL | Status: DC

## 2014-02-15 NOTE — Progress Notes (Signed)
   Subjective:    Patient ID: Rodney Wade, male    DOB: 1956-07-27, 58 y.o.   MRN: [id number]  HPI He is here for complete examination. He continues on his blood pressure medication and is having no difficulty with this. He exercises regularly and has concerns over his inability to lose weight. He does complain of fatigue but no skin, hair changes, symptoms of depression. He does not fall sleep while sitting in a chair or while driving. His stress levels are fairly constant. He presently is not taking Wellbutrin and psychologically seems to be doing well. He is up-to-date on his immunizations. He will need a colonoscopy and I will give him that he Zostavax when he turns 53. Social and family history were reviewed   Review of Systems  All other systems reviewed and are negative.      Objective:   Physical Exam BP 100/72  Pulse 60  Ht 5\' 7"  (1.702 m)  Wt 190 lb (86.183 kg)  BMI 29.75 kg/m2  General Appearance:    Alert, cooperative, no distress, appears stated age  Head:    Normocephalic, without obvious abnormality, atraumatic  Eyes:    PERRL, conjunctiva/corneas clear, EOM's intact, fundi    benign  Ears:    Normal TM's and external ear canals  Nose:   Nares normal, mucosa normal, no drainage or sinus   tenderness  Throat:   Lips, mucosa, and tongue normal; teeth and gums normal  Neck:   Supple, no lymphadenopathy;  thyroid:  no   enlargement/tenderness/nodules; no carotid   bruit or JVD  Back:    Spine nontender, no curvature, ROM normal, no CVA     tenderness  Lungs:     Clear to auscultation bilaterally without wheezes, rales or     ronchi; respirations unlabored  Chest Wall:    No tenderness or deformity   Heart:    Regular rate and rhythm, S1 and S2 normal, no murmur, rub   or gallop  Breast Exam:    No chest wall tenderness, masses or gynecomastia  Abdomen:     Soft, non-tender, nondistended, normoactive bowel sounds,    no masses, no hepatosplenomegaly          Extremities:   No clubbing, cyanosis or edema  Pulses:   2+ and symmetric all extremities  Skin:   Skin color, texture, turgor normal, no rashes or lesions  Lymph nodes:   Cervical, supraclavicular, and axillary nodes normal  Neurologic:   CNII-XII intact, normal strength, sensation and gait; reflexes 2+ and symmetric throughout          Psych:   Normal mood, affect, hygiene and grooming.          Assessment & Plan:  Routine general medical examination at a health care facility - Plan: Urinalysis Dipstick, CBC with Differential, Comprehensive metabolic panel, Lipid panel  Hypertension - Plan: lisinopril-hydrochlorothiazide (PRINZIDE,ZESTORETIC) 10-12.5 MG per tablet  Obesity (BMI 30-39.9) - Plan: CBC with Differential, Comprehensive metabolic panel, Lipid panel  he is to call after he turns 62 shingles and for colonoscopy.

## 2014-05-18 ENCOUNTER — Telehealth: Payer: Self-pay | Admitting: Family Medicine

## 2014-05-18 NOTE — Telephone Encounter (Signed)
Please call, patient requesting rx for strep    States he normally gets once a year   Fever, sore throat, headache, achy Advised patient that this typically requires an appointment but that I would send the message back   [facility] [place]

## 2014-05-18 NOTE — Telephone Encounter (Signed)
I can't call out antibiotic for this.  If needed, we can work him in now.

## 2014-05-23 NOTE — Telephone Encounter (Signed)
lm

## 2014-08-16 ENCOUNTER — Other Ambulatory Visit: Payer: Self-pay | Admitting: Family Medicine

## 2014-09-06 ENCOUNTER — Encounter (HOSPITAL_COMMUNITY): Payer: Self-pay | Admitting: Emergency Medicine

## 2014-09-06 ENCOUNTER — Emergency Department (HOSPITAL_COMMUNITY): Payer: BC Managed Care – PPO

## 2014-09-06 ENCOUNTER — Emergency Department (HOSPITAL_COMMUNITY)
Admission: EM | Admit: 2014-09-06 | Discharge: 2014-09-06 | Disposition: A | Discharge status: Home / Self Care | Payer: BC Managed Care – PPO | Attending: Emergency Medicine | Admitting: Emergency Medicine

## 2014-09-06 DIAGNOSIS — R1084 Generalized abdominal pain: Secondary | ICD-10-CM | POA: Diagnosis not present

## 2014-09-06 DIAGNOSIS — I10 Essential (primary) hypertension: Secondary | ICD-10-CM | POA: Diagnosis not present

## 2014-09-06 DIAGNOSIS — Z8719 Personal history of other diseases of the digestive system: Secondary | ICD-10-CM | POA: Insufficient documentation

## 2014-09-06 DIAGNOSIS — R0981 Nasal congestion: Secondary | ICD-10-CM | POA: Insufficient documentation

## 2014-09-06 DIAGNOSIS — R197 Diarrhea, unspecified: Secondary | ICD-10-CM | POA: Insufficient documentation

## 2014-09-06 DIAGNOSIS — Z79899 Other long term (current) drug therapy: Secondary | ICD-10-CM | POA: Diagnosis not present

## 2014-09-06 DIAGNOSIS — R000 Tachycardia, unspecified: Secondary | ICD-10-CM | POA: Insufficient documentation

## 2014-09-06 DIAGNOSIS — R05 Cough: Secondary | ICD-10-CM | POA: Diagnosis not present

## 2014-09-06 DIAGNOSIS — Z7982 Long term (current) use of aspirin: Secondary | ICD-10-CM | POA: Insufficient documentation

## 2014-09-06 DIAGNOSIS — J3489 Other specified disorders of nose and nasal sinuses: Secondary | ICD-10-CM | POA: Insufficient documentation

## 2014-09-06 DIAGNOSIS — G43909 Migraine, unspecified, not intractable, without status migrainosus: Secondary | ICD-10-CM | POA: Diagnosis not present

## 2014-09-06 DIAGNOSIS — Z8739 Personal history of other diseases of the musculoskeletal system and connective tissue: Secondary | ICD-10-CM | POA: Diagnosis not present

## 2014-09-06 DIAGNOSIS — R059 Cough, unspecified: Secondary | ICD-10-CM

## 2014-09-06 LAB — BASIC METABOLIC PANEL
ANION GAP: 8 (ref 5–15)
BUN: 13 mg/dL (ref 6–23)
CALCIUM: 9.4 mg/dL (ref 8.4–10.5)
CHLORIDE: 104 meq/L (ref 96–112)
CO2: 27 mmol/L (ref 19–32)
CREATININE: 1.1 mg/dL (ref 0.50–1.35)
GFR calc non Af Amer: 71 mL/min — ABNORMAL LOW (ref >90)
GFR, EST AFRICAN AMERICAN: 82 mL/min — AB (ref >90)
Glucose, Bld: 108 mg/dL — ABNORMAL HIGH (ref 70–99)
Potassium: 3.7 mmol/L (ref 3.5–5.1)
Sodium: 139 mmol/L (ref 135–145)

## 2014-09-06 LAB — CBC
HCT: 44.3 % (ref 39.0–52.0)
HEMOGLOBIN: 14.9 g/dL (ref 13.0–17.0)
MCH: 30.8 pg (ref 26.0–34.0)
MCHC: 33.6 g/dL (ref 30.0–36.0)
MCV: 91.7 fL (ref 78.0–100.0)
Platelets: 245 10*3/uL (ref 150–400)
RBC: 4.83 MIL/uL (ref 4.22–5.81)
RDW: 13.2 % (ref 11.5–15.5)
WBC: 10.6 10*3/uL — ABNORMAL HIGH (ref 4.0–10.5)

## 2014-09-06 MED ORDER — PREDNISONE 20 MG PO TABS: 60.0000 mg | ORAL_TABLET | Freq: Every day | ORAL | Status: AC

## 2014-09-06 MED ORDER — HYDROCOD POLST-CHLORPHEN POLST 10-8 MG/5ML PO LQCR: 5.0000 mL | Freq: Two times a day (BID) | ORAL | Status: DC | PRN

## 2014-09-06 MED ORDER — SODIUM CHLORIDE 0.9 % IV BOLUS (SEPSIS)
1000.0000 mL | Freq: Once | INTRAVENOUS | Status: AC
  Administered 2014-09-06: 1000 mL via INTRAVENOUS

## 2014-09-06 MED ORDER — PREDNISONE 20 MG PO TABS
60.0000 mg | ORAL_TABLET | ORAL | Status: AC
  Administered 2014-09-06: 60 mg via ORAL
  Filled 2014-09-06: qty 3

## 2014-09-06 NOTE — ED Provider Notes (Signed)
CSN: [id number]     Arrival date & time 09/06/14  1813 History   First MD Initiated Contact with Patient 09/06/14 1823     Chief Complaint  Patient presents with  . URI      HPI  Patient presents with concern of ongoing cough, congestion, new diarrhea. Symptoms began 3 days ago, initially with rhinorrhea, sinus congestion.  In the interval symptoms have progressed to include a chest congestion. No fever, vomiting, confusion, disorientation. Today the patient notes some abdominal discomfort, without focal pain, and has had several episodes of loose stool. No relief with Robitussin.   Past Medical History  Diagnosis Date  . Hypertension   . Gout   . HH (hiatus hernia)   . History of benign essential tremor   . Migraine headache    History reviewed. No pertinent past surgical history. Family History  Problem Relation Age of Onset  . Hypertension Mother   . Hypertension Father    History  Substance Use Topics  . Smoking status: Never Smoker   . Smokeless tobacco: Never Used  . Alcohol Use: 3.0 oz/week    5 Glasses of wine per week    Review of Systems  Constitutional:       Per HPI, otherwise negative  HENT:       Per HPI, otherwise negative  Respiratory:       Per HPI, otherwise negative  Cardiovascular:       Per HPI, otherwise negative  Gastrointestinal: Negative for vomiting.  Endocrine:       Negative aside from HPI  Genitourinary:       Neg aside from HPI   Musculoskeletal:       Per HPI, otherwise negative  Skin: Negative.   Neurological: Negative for syncope.      Allergies  Review of patient's allergies indicates no known allergies.  Home Medications   Prior to Admission medications   Medication Sig Start Date End Date Taking? Authorizing Provider  aspirin 81 MG tablet Take 81 mg by mouth daily.     Yes Historical Provider, MD  guaiFENesin (ROBITUSSIN) 100 MG/5ML SOLN Take 5 mLs by mouth every 4 (four) hours as needed for cough or to loosen  phlegm.   Yes Historical Provider, MD  ibuprofen (ADVIL,MOTRIN) 200 MG tablet Take 200 mg by mouth every 6 (six) hours as needed (pain.).   Yes Historical Provider, MD  lisinopril-hydrochlorothiazide (PRINZIDE,ZESTORETIC) 10-12.5 MG per tablet TAKE 1 TABLET BY MOUTH DAILY 08/17/14  Yes Denita Lung, MD  Multiple Vitamin (MULTIVITAMIN WITH MINERALS) TABS tablet Take 1 tablet by mouth daily.   Yes Historical Provider, MD  Throat Lozenges (COUGH DROPS MENTHOL MT) Use as directed 1 tablet in the mouth or throat as needed (cough.).   Yes Historical Provider, MD   BP 129/88 mmHg  Pulse 117  Temp(Src) 99.2 F (37.3 C) (Oral)  Resp 18  SpO2 99% Physical Exam  Constitutional: He is oriented to person, place, and time. He appears well-developed. No distress.  HENT:  Head: Normocephalic and atraumatic.  Eyes: Conjunctivae and EOM are normal.  Cardiovascular: Regular rhythm.  Tachycardia present.   Pulmonary/Chest: Effort normal. No stridor. No respiratory distress. He has decreased breath sounds.  Abdominal: He exhibits no distension.  Musculoskeletal: He exhibits no edema.  Neurological: He is alert and oriented to person, place, and time.  Skin: Skin is warm and dry.  Psychiatric: He has a normal mood and affect.  Nursing note and vitals reviewed.  ED Course  Procedures (including critical care time) Labs Review Labs Reviewed  BASIC METABOLIC PANEL - Abnormal; Notable for the following:    Glucose, Bld 108 (*)    GFR calc non Af Amer 71 (*)    GFR calc Af Amer 82 (*)    All other components within normal limits  CBC - Abnormal; Notable for the following:    WBC 10.6 (*)    All other components within normal limits    Imaging Review Dg Chest 2 View  09/06/2014   CLINICAL DATA:  Upper respiratory infection symptoms 2-3 days. Cough and congestion.  EXAM: CHEST  2 VIEW  COMPARISON:  None.  FINDINGS: Lungs are hypoinflated but otherwise clear. Cardiomediastinal silhouette is within  normal. There is minimal degenerative change of the spine.  IMPRESSION: No active cardiopulmonary disease.   Electronically Signed   By: Marin Olp M.D.   On: 09/06/2014 18:54    On repeat exam the patient is sitting upright, in no distress.  Patient states that he feels better.    MDM  Patient presents with 3 days of cough, congestion, generalized discomfort. Patient is awake, alert, neurologically intact, hemodynamically stable aside from mild tachycardia. Patient does not smoke, and there is low suspicion for occult bacterial infection / sepsis.  Patient may have mild bronchitis, given his PE findings.  He was d/c in stable condition w PMD F/U.     Carmin Muskrat, MD 09/06/14 2010

## 2014-09-06 NOTE — Discharge Instructions (Signed)
As discussed, your evaluation today has been largely reassuring.  But, it is important that you monitor your condition carefully, and do not hesitate to return to the ED if you develop new, or concerning changes in your condition.  Your symptoms may be due to a mild case of bronchitis or a viral infection.  Please follow-up with your physician for appropriate ongoing care.

## 2014-09-06 NOTE — ED Notes (Signed)
Per pt, states cold symptoms for 2 days, increased cough-diarrhea which started this am

## 2014-09-16 ENCOUNTER — Other Ambulatory Visit: Payer: Self-pay | Admitting: Family Medicine

## 2014-10-17 ENCOUNTER — Encounter: Payer: Self-pay | Admitting: Family Medicine

## 2014-10-17 ENCOUNTER — Ambulatory Visit (INDEPENDENT_AMBULATORY_CARE_PROVIDER_SITE_OTHER): Payer: BLUE CROSS/BLUE SHIELD | Admitting: Family Medicine

## 2014-10-17 VITALS — BP 126/74 | Wt 189.0 lb

## 2014-10-17 DIAGNOSIS — Z1211 Encounter for screening for malignant neoplasm of colon: Secondary | ICD-10-CM

## 2014-10-17 DIAGNOSIS — K6289 Other specified diseases of anus and rectum: Secondary | ICD-10-CM

## 2014-10-17 MED ORDER — ALPRAZOLAM 0.25 MG PO TABS: 0.2500 mg | ORAL_TABLET | Freq: Two times a day (BID) | ORAL | Status: DC | PRN

## 2014-10-17 NOTE — Progress Notes (Signed)
   Subjective:    Patient ID: Rodney Wade, male    DOB: [birth year], 59 y.o.   MRN: [id number]  HPI He complains of some slight discomfort and fullness in the perirectal area today. He thought he might have an insect bite to that area. He also is getting ready to go on a trip to [place] for work and would like Xanax to help with the anxiety. He also did not get scheduled for colonoscopy and would like to set up.   Review of Systems     Objective:   Physical Exam Visual inspection of the perianal area shows no visible lesions or hemorrhoids. Palpation shows no tenderness or palpable lesions.       Assessment & Plan:  Perirectal discomfort  Special screening for malignant neoplasms, colon - Plan: Ambulatory referral to Gastroenterology  Discussed this being the early stage of the pararectal abscess and recommended vigilance concerning this. We'll also set him up for routine colonoscopy. Senna will be called in to help with work related anxiety and flying.

## 2014-11-19 ENCOUNTER — Encounter: Payer: Self-pay | Admitting: Internal Medicine

## 2015-01-02 ENCOUNTER — Ambulatory Visit (AMBULATORY_SURGERY_CENTER): Payer: Self-pay | Admitting: *Deleted

## 2015-01-02 VITALS — Ht 66.5 in | Wt 193.8 lb

## 2015-01-02 DIAGNOSIS — Z1211 Encounter for screening for malignant neoplasm of colon: Secondary | ICD-10-CM

## 2015-01-02 NOTE — Progress Notes (Signed)
Denies allergies to eggs or soy products. Denies complications with sedation or anesthesia. Denies O2 use. Denies use of diet or weight loss medications.  Emmi instructions given for colonoscopy.  Patient does not recall where he had prior colonoscopy. Records not requested.

## 2015-01-11 ENCOUNTER — Encounter: Payer: Self-pay | Admitting: Internal Medicine

## 2015-01-16 ENCOUNTER — Ambulatory Visit (AMBULATORY_SURGERY_CENTER): Payer: BLUE CROSS/BLUE SHIELD | Admitting: Internal Medicine

## 2015-01-16 ENCOUNTER — Encounter: Payer: Self-pay | Admitting: Internal Medicine

## 2015-01-16 VITALS — BP 128/72 | HR 71 | Temp 96.4°F | Resp 21 | Ht 66.0 in | Wt 193.0 lb

## 2015-01-16 DIAGNOSIS — D122 Benign neoplasm of ascending colon: Secondary | ICD-10-CM

## 2015-01-16 DIAGNOSIS — Z1211 Encounter for screening for malignant neoplasm of colon: Secondary | ICD-10-CM | POA: Diagnosis not present

## 2015-01-16 MED ORDER — SODIUM CHLORIDE 0.9 % IV SOLN: 500.0000 mL | INTRAVENOUS | Status: DC

## 2015-01-16 NOTE — Op Note (Signed)
[place]  [address] [place], [id number]   COLONOSCOPY PROCEDURE REPORT  PATIENT: Roth, Ress  MR#: [id number] BIRTHDATE: Jun 14, 1956 , 35  yrs. old GENDER: male ENDOSCOPIST: Jerene Bears, MD REFERRED OI:ZTIW Redmond [employer], M.D. PROCEDURE DATE:  01/16/2015 PROCEDURE:   Colonoscopy, screening and Colonoscopy with cold biopsy polypectomy First Screening Colonoscopy - Avg.  risk and is 50 yrs.  old or older - No.  Prior Negative Screening - Now for repeat screening. 10 or more years since last screening  History of Adenoma - Now for follow-up colonoscopy & has been > or = to 3 yrs.  N/A  Polyps Removed Today ASA CLASS:   Class II INDICATIONS:Screening for colonic neoplasia and Colorectal Neoplasm Risk Assessment for this procedure is average risk. MEDICATIONS: Monitored anesthesia care and Propofol 230 mg IV  DESCRIPTION OF PROCEDURE:   After the risks benefits and alternatives of the procedure were thoroughly explained, informed consent was obtained.  The digital rectal exam revealed no rectal mass.   The LB [id number]  endoscope was introduced through the anus and advanced to the cecum, which was identified by both the appendix and ileocecal valve. No adverse events experienced. The quality of the prep was good.  (MoviPrep was used)  The instrument was then slowly withdrawn as the colon was fully examined.  COLON FINDINGS: A sessile polyp measuring 3 mm in size was found in the ascending colon.  A polypectomy was performed with cold forceps.  The resection was complete, the polyp tissue was completely retrieved and sent to histology.   There was mild diverticulosis noted in the descending colon and sigmoid colon. Retroflexed views revealed no abnormalities. The time to cecum = 2.3 Withdrawal time = 10.4   The scope was withdrawn and the procedure completed. COMPLICATIONS: There were no immediate complications.  ENDOSCOPIC IMPRESSION: 1.   Sessile  polyp was found in the ascending colon; polypectomy was performed with cold forceps 2.   Mild diverticulosis was noted in the descending colon and sigmoid colon  RECOMMENDATIONS: 1.  Await pathology results 2.  High fiber diet 3.  If the polyp removed today is proven to be an adenomatous (pre-cancerous) polyp, you will need a repeat colonoscopy in 5 years.  Otherwise you should continue to follow colorectal cancer screening guidelines for "routine risk" patients with colonoscopy in 10 years.  You will receive a letter within 1-2 weeks with the results of your biopsy as well as final recommendations.  Please call my office if you have not received a letter after 3 weeks.  eSigned:  Jerene Bears, MD 01/16/2015 9:02 AM   cc: Jill Alexanders, MD and The Patient

## 2015-01-16 NOTE — Progress Notes (Signed)
To recovery, report to Myers, RN, VSS. 

## 2015-01-16 NOTE — Progress Notes (Signed)
Called to room to assist during endoscopic procedure.  Patient ID and intended procedure confirmed with present staff. Received instructions for my participation in the procedure from the performing physician.  

## 2015-01-16 NOTE — Patient Instructions (Signed)
YOU HAD AN ENDOSCOPIC PROCEDURE TODAY AT [facility]:   Refer to the procedure report that was given to you for any specific questions about what was found during the examination.  If the procedure report does not answer your questions, please call your gastroenterologist to clarify.  If you requested that your care partner not be given the details of your procedure findings, then the procedure report has been included in a sealed envelope for you to review at your convenience later.  YOU SHOULD EXPECT: Some feelings of bloating in the abdomen. Passage of more gas than usual.  Walking can help get rid of the air that was put into your GI tract during the procedure and reduce the bloating. If you had a lower endoscopy (such as a colonoscopy or flexible sigmoidoscopy) you may notice spotting of blood in your stool or on the toilet paper. If you underwent a bowel prep for your procedure, you may not have a normal bowel movement for a few days.  Please Note:  You might notice some irritation and congestion in your nose or some drainage.  This is from the oxygen used during your procedure.  There is no need for concern and it should clear up in a day or so.  SYMPTOMS TO REPORT IMMEDIATELY:   Following lower endoscopy (colonoscopy or flexible sigmoidoscopy):  Excessive amounts of blood in the stool  Significant tenderness or worsening of abdominal pains  Swelling of the abdomen that is new, acute  Fever of 100F or higher  For urgent or emergent issues, a gastroenterologist can be reached at any hour by calling [phone].   DIET: Your first meal following the procedure should be a small meal and then it is ok to progress to your normal diet. Heavy or fried foods are harder to digest and may make you feel nauseous or bloated.  Likewise, meals heavy in dairy and vegetables can increase bloating.  Drink plenty of fluids but you should avoid alcoholic beverages for 24  hours.  ACTIVITY:  You should plan to take it easy for the rest of today and you should NOT DRIVE or use heavy machinery until tomorrow (because of the sedation medicines used during the test).    FOLLOW UP: Our staff will call the number listed on your records the next business day following your procedure to check on you and address any questions or concerns that you may have regarding the information given to you following your procedure. If we do not reach you, we will leave a message.  However, if you are feeling well and you are not experiencing any problems, there is no need to return our call.  We will assume that you have returned to your regular daily activities without incident.  If any biopsies were taken you will be contacted by phone or by letter within the next 1-3 weeks.  Please call us at [phone] if you have not heard about the biopsies in 3 weeks.    SIGNATURES/CONFIDENTIALITY: You and/or your care partner have signed paperwork which will be entered into your electronic medical record.  These signatures attest to the fact that that the information above on your After Visit Summary has been reviewed and is understood.  Full responsibility of the confidentiality of this discharge information lies with you and/or your care-partner.  Recommendations Please refer to the handout provided (After Visit Summary) for questions regarding discharge instructions covered in recovery.   Next colonoscopy determined by pathology  results; 5 or 10 years. Please review polyp, diverticulosis, and high fiber diet handouts provided.

## 2015-01-17 ENCOUNTER — Telehealth: Payer: Self-pay | Admitting: *Deleted

## 2015-01-17 ENCOUNTER — Ambulatory Visit (INDEPENDENT_AMBULATORY_CARE_PROVIDER_SITE_OTHER): Payer: BLUE CROSS/BLUE SHIELD | Admitting: Medical

## 2015-01-17 ENCOUNTER — Encounter: Payer: Self-pay | Admitting: Medical

## 2015-01-17 VITALS — BP 140/80 | HR 78 | Temp 98.0°F | Resp 15 | Wt 192.0 lb

## 2015-01-17 DIAGNOSIS — R232 Flushing: Secondary | ICD-10-CM

## 2015-01-17 DIAGNOSIS — Z113 Encounter for screening for infections with a predominantly sexual mode of transmission: Secondary | ICD-10-CM

## 2015-01-17 DIAGNOSIS — R21 Rash and other nonspecific skin eruption: Secondary | ICD-10-CM

## 2015-01-17 DIAGNOSIS — M7052 Other bursitis of knee, left knee: Secondary | ICD-10-CM

## 2015-01-17 DIAGNOSIS — M2540 Effusion, unspecified joint: Secondary | ICD-10-CM

## 2015-01-17 LAB — COMPREHENSIVE METABOLIC PANEL
ALBUMIN: 4.4 g/dL (ref 3.5–5.2)
ALT: 16 U/L (ref 0–53)
AST: 16 U/L (ref 0–37)
Alkaline Phosphatase: 46 U/L (ref 39–117)
BUN: 14 mg/dL (ref 6–23)
CHLORIDE: 104 meq/L (ref 96–112)
CO2: 24 meq/L (ref 19–32)
Calcium: 9.2 mg/dL (ref 8.4–10.5)
Creat: 0.83 mg/dL (ref 0.50–1.35)
GLUCOSE: 81 mg/dL (ref 70–99)
Potassium: 4.2 mEq/L (ref 3.5–5.3)
SODIUM: 139 meq/L (ref 135–145)
Total Bilirubin: 0.5 mg/dL (ref 0.2–1.2)
Total Protein: 7.4 g/dL (ref 6.0–8.3)

## 2015-01-17 LAB — CBC
HCT: 42.3 % (ref 39.0–52.0)
Hemoglobin: 14.4 g/dL (ref 13.0–17.0)
MCH: 31 pg (ref 26.0–34.0)
MCHC: 34 g/dL (ref 30.0–36.0)
MCV: 91.2 fL (ref 78.0–100.0)
MPV: 9.8 fL (ref 8.6–12.4)
PLATELETS: 252 10*3/uL (ref 150–400)
RBC: 4.64 MIL/uL (ref 4.22–5.81)
RDW: 14.1 % (ref 11.5–15.5)
WBC: 7.6 10*3/uL (ref 4.0–10.5)

## 2015-01-17 NOTE — Progress Notes (Signed)
Subjective Here for a few symptoms.  Has new rash that started 2 days ago at night time.  Started on soles of feet but now has on both soles and palms.  No other rash.   No other skin changes.  Rash is itchy on soles and palms, sensitive to heat, hands burned like a sunburn.  Feels swollen of feet and hands like can't close hands all the way.  Started prep for colonoscopy Tuesday night. This is the only new exposure. No prior similar.   Did have a cold last week that resolved, runny nose, headache, congestion, mild cough, but no fever, NVD.  Denies vision changes, urinary changes, bowel changes, no diarrhea, no penile discharge.  Has new male sexual partner within last month or so but using protection. Nonsmoker.  Drinks some socially.  No drug use.No other aggravating or relieving factors.   Also fell on left knee recently tripped.  Has some mild pain.  Kneels to pray at church which aggravates the knee pain.   No other complaint.   Past Medical History  Diagnosis Date  . Hypertension   . Gout   . HH (hiatus hernia)   . History of benign essential tremor   . Migraine headache    ROS as in subjective  Objective: BP 140/80 mmHg  Pulse 78  Temp(Src) 98 F (36.7 C) (Oral)  Resp 15  Wt 192 lb (87.091 kg)  General appearance: alert, no distress, WD/WN Skin: flushing of bilat palms over MCPs but no distinct rash, and similar flushing of soles, but no distinct rash, slight puffiness of soles and hands but no other joint swelling, joints and extremities nontender, no other deformity, no mouth lesions, no genital skin lesions HEENT: normocephalic, sclerae anicteric, TMs pearly, nares patent, no discharge or erythema, pharynx normal Oral cavity: MMM, no lesions Neck: supple, no lymphadenopathy, no thyromegaly, no masses Heart: RRR, normal S1, S2, no murmurs Lungs: CTA bilaterally, no wheezes, rhonchi, or rales Abdomen: +bs, soft, non tender, non distended, no masses, no hepatomegaly, no  splenomegaly Pulses: 2+ symmetric, upper and lower extremities, normal cap refill Ext: no edema GU: normal male genitalia circumcised, no mass, no lymphadenopathy, no mass MSK: left knee with slight tenderness and swelling of patellar bursa, otherwise non tender, no laxity, nontender, no other deformity    Assessment: Encounter Diagnoses  Name Primary?  . Joint swelling Yes  . Skin blushing/flushing   . Rash and nonspecific skin eruption   . Screen for STD (sexually transmitted disease)   . Patellar bursitis, left     Plan Discussed case with Dr. Redmond School who also examined patient.   Nonspecific etiology.   discussed possible causes . Advised he call back if new symptoms or signs.  otherwise rest, hydrate well ,can use Ibuprofen or aleve OTC.  Ice pack for knee, avoid kneeling the next 3-5 days to avoid flaring up knee worse.

## 2015-01-17 NOTE — Telephone Encounter (Signed)
  Follow up Call-  Call back number 01/16/2015  Post procedure Call Back phone  # [phone]  Permission to leave phone message Yes     Patient questions:  Do you have a fever, pain , or abdominal swelling? No. Pain Score  0 *  Have you tolerated food without any problems? Yes.    Have you been able to return to your normal activities? Yes.    Do you have any questions about your discharge instructions: Diet   No. Medications  No. Follow up visit  No.  Do you have questions or concerns about your Care? No.  Actions: * If pain score is 4 or above: No action needed, pain <4.

## 2015-01-18 LAB — GC/CHLAMYDIA PROBE AMP
CT PROBE, AMP APTIMA: NEGATIVE
GC Probe RNA: NEGATIVE

## 2015-01-18 LAB — HIV ANTIBODY (ROUTINE TESTING W REFLEX): HIV 1&2 Ab, 4th Generation: NONREACTIVE

## 2015-01-18 LAB — SEDIMENTATION RATE: Sed Rate: 5 mm/hr (ref 0–20)

## 2015-01-18 LAB — RPR

## 2015-01-24 ENCOUNTER — Encounter: Payer: Self-pay | Admitting: Internal Medicine

## 2015-03-30 ENCOUNTER — Emergency Department (HOSPITAL_COMMUNITY)
Admission: EM | Admit: 2015-03-30 | Discharge: 2015-03-30 | Disposition: A | Discharge status: Home / Self Care | Payer: BLUE CROSS/BLUE SHIELD | Source: Home / Self Care | Attending: Family Medicine | Admitting: Family Medicine

## 2015-03-30 ENCOUNTER — Encounter (HOSPITAL_COMMUNITY): Payer: Self-pay | Admitting: *Deleted

## 2015-03-30 DIAGNOSIS — L03011 Cellulitis of right finger: Secondary | ICD-10-CM | POA: Diagnosis not present

## 2015-03-30 MED ORDER — DOXYCYCLINE HYCLATE 100 MG PO CAPS: 100.0000 mg | ORAL_CAPSULE | Freq: Two times a day (BID) | ORAL | Status: DC

## 2015-03-30 NOTE — ED Provider Notes (Signed)
CSN: [id number]     Arrival date & time 03/30/15  1305 History   First MD Initiated Contact with Patient 03/30/15 1336     Chief Complaint  Patient presents with  . Hand Pain   (Consider location/radiation/quality/duration/timing/severity/associated sxs/prior Treatment) Patient is a 59 y.o. male presenting with hand pain. The history is provided by the patient.  Hand Pain This is a new problem. The current episode started more than 2 days ago. The problem has been gradually worsening.    Past Medical History  Diagnosis Date  . Hypertension   . Gout   . HH (hiatus hernia)   . History of benign essential tremor   . Migraine headache    Past Surgical History  Procedure Laterality Date  . Bone spur removal Right 2006  . Ganglion cyst excision     Family History  Problem Relation Age of Onset  . Hypertension Mother   . Hypertension Father   . Colon cancer Neg Hx    History  Substance Use Topics  . Smoking status: Never Smoker   . Smokeless tobacco: Never Used  . Alcohol Use: Yes     Comment: occasional    Review of Systems  Constitutional: Negative.   Musculoskeletal: Positive for joint swelling.  Skin: Positive for wound.    Allergies  Review of patient's allergies indicates no known allergies.  Home Medications   Prior to Admission medications   Medication Sig Start Date End Date Taking? Authorizing Provider  aspirin 81 MG tablet Take 81 mg by mouth daily.     Yes Historical Provider, MD  lisinopril-hydrochlorothiazide (PRINZIDE,ZESTORETIC) 10-12.5 MG per tablet TAKE 1 TABLET BY MOUTH EVERY DAY 09/17/14  Yes Denita Lung, MD  Multiple Vitamin (MULTIVITAMIN WITH MINERALS) TABS tablet Take 1 tablet by mouth daily.   Yes Historical Provider, MD  ALPRAZolam (XANAX) 0.25 MG tablet Take 1 tablet (0.25 mg total) by mouth 2 (two) times daily as needed for anxiety. Patient not taking: Reported on 01/02/2015 10/17/14   Denita Lung, MD  doxycycline (VIBRAMYCIN) 100 MG  capsule Take 1 capsule (100 mg total) by mouth 2 (two) times daily. 03/30/15   Billy Fischer, MD  ibuprofen (ADVIL,MOTRIN) 200 MG tablet Take 200 mg by mouth every 6 (six) hours as needed (pain.).    Historical Provider, MD   BP 138/85 mmHg  Pulse 69  Temp(Src) 97.8 F (36.6 C) (Oral)  Resp 16  SpO2 96% Physical Exam  Constitutional: He is oriented to person, place, and time. He appears well-developed and well-nourished. No distress.  Musculoskeletal: He exhibits tenderness.       Hands: Neurological: He is alert and oriented to person, place, and time.  Skin: Skin is warm and dry. There is erythema.  Nursing note and vitals reviewed.   ED Course  INCISION AND DRAINAGE Date/Time: 03/30/2015 2:02 PM Performed by: Ihor Gully D Authorized by: Ihor Gully D Type: abscess Body area: upper extremity Location details: right index finger Local anesthetic: topical anesthetic Patient sedated: no Scalpel size: 11 Incision type: single straight Complexity: simple Drainage: purulent Drainage amount: moderate Wound treatment: wound left open Patient tolerance: Patient tolerated the procedure well with no immediate complications Comments: Culture obtained.   (including critical care time) Labs Review Labs Reviewed  CULTURE, ROUTINE-ABSCESS    Imaging Review No results found.   MDM   1. Paronychia of finger of right hand        Billy Fischer, MD 03/30/15 1406

## 2015-03-30 NOTE — ED Notes (Signed)
Paronychia noted to right index finger; started 3 days ago.  Denies any fevers.

## 2015-03-30 NOTE — ED Notes (Signed)
Right index finger soaking in Betadine solution following simple I&D.

## 2015-03-30 NOTE — Discharge Instructions (Signed)
Soak twice a day for 5 days in warm water, take all of medicine, return if any problems.

## 2015-04-02 LAB — CULTURE, ROUTINE-ABSCESS: Gram Stain: NONE SEEN

## 2015-04-02 NOTE — ED Notes (Signed)
Final report positive for staph aureus, Rx provided day of visit adequate treatment

## 2015-04-08 ENCOUNTER — Ambulatory Visit (INDEPENDENT_AMBULATORY_CARE_PROVIDER_SITE_OTHER): Payer: BLUE CROSS/BLUE SHIELD | Admitting: Family Medicine

## 2015-04-08 ENCOUNTER — Encounter: Payer: Self-pay | Admitting: Family Medicine

## 2015-04-08 VITALS — BP 114/74 | HR 80 | Ht 67.0 in | Wt 191.0 lb

## 2015-04-08 DIAGNOSIS — Z23 Encounter for immunization: Secondary | ICD-10-CM | POA: Diagnosis not present

## 2015-04-08 DIAGNOSIS — Z0000 Encounter for general adult medical examination without abnormal findings: Secondary | ICD-10-CM

## 2015-04-08 DIAGNOSIS — E669 Obesity, unspecified: Secondary | ICD-10-CM

## 2015-04-08 DIAGNOSIS — Z8669 Personal history of other diseases of the nervous system and sense organs: Secondary | ICD-10-CM

## 2015-04-08 DIAGNOSIS — I10 Essential (primary) hypertension: Secondary | ICD-10-CM

## 2015-04-08 LAB — POCT URINALYSIS DIPSTICK
BILIRUBIN UA: NEGATIVE
Glucose, UA: NEGATIVE
KETONES UA: NEGATIVE
Leukocytes, UA: NEGATIVE
Nitrite, UA: NEGATIVE
PROTEIN UA: NEGATIVE
RBC UA: NEGATIVE
Spec Grav, UA: 1.02
Urobilinogen, UA: NEGATIVE
pH, UA: 6

## 2015-04-08 NOTE — Patient Instructions (Signed)
If you have episodes of swelling, take a picture of it and then bring it in.

## 2015-04-08 NOTE — Progress Notes (Signed)
Subjective:    Patient ID: Rodney Wade, male    DOB: September 05, 1956, 59 y.o.   MRN: [id number]  HPI He is here for a complete examination. He is being treated presently for paronychia and is on doxycycline. He also is noted one or 2 occasions of the tongue being swollen on one side or at least feeling like that and then going away within several minutes. He has had no new medications other than the doxycycline and cannot necessarily related to this. There is a questionable history of gout however further history indicates that after surgery was done on his toe, he has had more difficulty with that. He does have concerns over his weight. He has decreases carbs and made some exercise changes but his weight is essentially been stable. He is now in a relationship for the last 4 months which seems to be going well. He does not feel the need for STD testing. He continues on his lisinopril. His had no more difficulty with migraine headaches. He does not complain of chest pain, shortness of breath, GI issues He continues to work but is starting to develop his counseling rectus. He is considering retiring from his other job but does need insurance coverage. Family and social history as well as health maintenance and immunizations were reviewed.   Review of Systems  All other systems reviewed and are negative.      Objective:   Physical Exam BP 114/74 mmHg  Pulse 80  Ht 5\' 7"  (1.702 m)  Wt 191 lb (86.637 kg)  BMI 29.91 kg/m2  SpO2 98%  General Appearance:    Alert, cooperative, no distress, appears stated age  Head:    Normocephalic, without obvious abnormality, atraumatic  Eyes:    PERRL, conjunctiva/corneas clear, EOM's intact, fundi    benign  Ears:    Normal TM's and external ear canals  Nose:   Nares normal, mucosa normal, no drainage or sinus   tenderness  Throat:   Lips, mucosa, and tongue normal; teeth and gums normal  Neck:   Supple, no lymphadenopathy;  thyroid:  no    enlargement/tenderness/nodules; no carotid   bruit or JVD  Back:    Spine nontender, no curvature, ROM normal, no CVA     tenderness  Lungs:     Clear to auscultation bilaterally without wheezes, rales or     ronchi; respirations unlabored  Chest Wall:    No tenderness or deformity   Heart:    Regular rate and rhythm, S1 and S2 normal, no murmur, rub   or gallop  Breast Exam:    No chest wall tenderness, masses or gynecomastia  Abdomen:     Soft, non-tender, nondistended, normoactive bowel sounds,    no masses, no hepatosplenomegaly        Extremities:   No clubbing, cyanosis or edema  Pulses:   2+ and symmetric all extremities  Skin:   Skin color, texture, turgor normal, no rashes or lesions  Lymph nodes:   Cervical, supraclavicular, and axillary nodes normal  Neurologic:   CNII-XII intact, normal strength, sensation and gait; reflexes 2+ and symmetric throughout          Psych:   Normal mood, affect, hygiene and grooming.          Assessment & Plan:  Routine general medical examination at a health care facility - Plan: POCT Urinalysis Dipstick, Lipid panel  Essential hypertension  History of migraine headaches  Obesity (BMI 30-39.9) - Plan: Amb  ref to Medical Nutrition Therapy-MNT  Need for shingles vaccine  he will continue on his blood pressure medication although I did ask him to take a picture of the next time he has difficulty with swelling I explained that this could possibly be related to his lisinopril but was unsure. There is really no good history for gout and therefore have taken it out of his record. Discussed his weight issue. I will refer him to nutrition to see if they can help with his diet.

## 2015-04-09 LAB — LIPID PANEL
CHOL/HDL RATIO: 2.8 ratio (ref <=5.0)
CHOLESTEROL: 182 mg/dL (ref 125–200)
HDL: 64 mg/dL (ref >=40)
LDL Cholesterol: 98 mg/dL (ref <130)
TRIGLYCERIDES: 99 mg/dL (ref <150)
VLDL: 20 mg/dL (ref <30)

## 2015-04-11 ENCOUNTER — Other Ambulatory Visit: Payer: Self-pay | Admitting: Family Medicine

## 2015-05-06 ENCOUNTER — Other Ambulatory Visit: Payer: Self-pay | Admitting: Family Medicine

## 2015-11-25 ENCOUNTER — Encounter: Payer: Self-pay | Admitting: Family Medicine

## 2015-11-25 ENCOUNTER — Ambulatory Visit (INDEPENDENT_AMBULATORY_CARE_PROVIDER_SITE_OTHER): Payer: BLUE CROSS/BLUE SHIELD | Admitting: Family Medicine

## 2015-11-25 VITALS — BP 140/82 | HR 72 | Temp 98.4°F | Wt 192.6 lb

## 2015-11-25 DIAGNOSIS — R05 Cough: Secondary | ICD-10-CM | POA: Diagnosis not present

## 2015-11-25 DIAGNOSIS — R062 Wheezing: Secondary | ICD-10-CM | POA: Diagnosis not present

## 2015-11-25 DIAGNOSIS — R509 Fever, unspecified: Secondary | ICD-10-CM

## 2015-11-25 DIAGNOSIS — R059 Cough, unspecified: Secondary | ICD-10-CM

## 2015-11-25 LAB — POC INFLUENZA A&B (BINAX/QUICKVUE)
Influenza A, POC: NEGATIVE
Influenza B, POC: NEGATIVE

## 2015-11-25 MED ORDER — ALBUTEROL SULFATE HFA 108 (90 BASE) MCG/ACT IN AERS: 2.0000 | INHALATION_SPRAY | Freq: Four times a day (QID) | RESPIRATORY_TRACT | Status: DC | PRN

## 2015-11-25 MED ORDER — AZITHROMYCIN 250 MG PO TABS: ORAL_TABLET | ORAL | Status: DC

## 2015-11-25 MED ORDER — HYDROCODONE-HOMATROPINE 5-1.5 MG/5ML PO SYRP
5.0000 mL | ORAL_SOLUTION | Freq: Every evening | ORAL | Status: DC | PRN
Start: 2015-11-25 — End: 2016-04-08

## 2015-11-25 MED ORDER — HYDROCODONE-HOMATROPINE 5-1.5 MG/5ML PO SYRP: 5.0000 mL | ORAL_SOLUTION | Freq: Three times a day (TID) | ORAL | Status: DC | PRN

## 2015-11-25 NOTE — Progress Notes (Signed)
Subjective:  Shaheen Staloch is a 60 y.o. male who presents for a 4 day history of cough, fever, chills, fatigue. Also had 3 episodes of diarrhea 2 days ago. States cough is getting worse, keeping him up at night, but overall he feels about 20 % improved.  No more diarrhea or GI symptoms.    Denies body aches, ear pain, sore throat. Does not smoke. History of bronchitis and pneumonia in distant past. No recent antibiotic use.   Treatment to date: dayquil.  Probable sick contacts- has been traveling.  No other aggravating or relieving factors.  No other c/o.  ROS as in subjective.   Objective: Filed Vitals:   11/25/15 0955  BP: 140/82  Pulse: 72  Temp: 98.4 F (36.9 C)    General appearance: Alert, WD/WN, no distress, mildly ill appearing                             Skin: warm, no rash                           Head: no sinus tenderness                            Eyes: conjunctiva normal, corneas clear, PERRLA                            Ears: pearly TMs, external ear canals normal                          Nose: septum midline, turbinates swollen, with erythema and clear discharge             Mouth/throat: MMM, tongue normal, mild pharyngeal erythema                           Neck: supple, no adenopathy, no thyromegaly, nontender                          Heart: RRR, normal S1, S2, no murmurs                         Lungs: wheezes to right upper and lower lung fields and left lower lung fields, no rales or rhonchi. Lungs CTA post breathing treatment, no wheezing.     Pulse oximetry 98% prior to breathing treatment. 98% post breathing tx.   Assessment: Cough - Plan: azithromycin (ZITHROMAX Z-PAK) 250 MG tablet, albuterol (PROVENTIL HFA;VENTOLIN HFA) 108 (90 Base) MCG/ACT inhaler  Wheezing - Plan: azithromycin (ZITHROMAX Z-PAK) 250 MG tablet, albuterol (PROVENTIL HFA;VENTOLIN HFA) 108 (90 Base) MCG/ACT inhaler  Fever, unspecified fever cause - Plan: POC Influenza  A&B(BINAX/QUICKVUE)  Plan: Negative flu swab. Albuterol breathing treatment given in office for diffuse wheezing. Patient without wheezing after treatment.  Discussed diagnosis and treatment of bronchitis.  Z-pak prescription sent to pharmacy. Albuterol inhaler sent prescription sent to pharmacy with instructions for use as needed for coughing or wheezing. Hycodan prescription given to patient for use as needed for cough at bedtime. Discussed not drinking alcohol or driving if taking this medication. Suggested symptomatic OTC remedies. Nasal saline spray for congestion.  Tylenol or Ibuprofen OTC for fever and malaise.  Call/return if not  back to baseline after completing the antibiotic or sooner if he gets worse.

## 2015-11-25 NOTE — Patient Instructions (Addendum)
Start the Z-Pak today. Use the albuterol inhaler as discussed, as needed. If you take the Hycodan at night for cough make sure you're not driving or drinking alcohol with this.  Cough, Adult Coughing is a reflex that clears your throat and your airways. Coughing helps to heal and protect your lungs. It is normal to cough occasionally, but a cough that happens with other symptoms or lasts a long time may be a sign of a condition that needs treatment. A cough may last only 2-3 weeks (acute), or it may last longer than 8 weeks (chronic). CAUSES Coughing is commonly caused by:  Breathing in substances that irritate your lungs.  A viral or bacterial respiratory infection.  Allergies.  Asthma.  Postnasal drip.  Smoking.  Acid backing up from the stomach into the esophagus (gastroesophageal reflux).  Certain medicines.  Chronic lung problems, including COPD (or rarely, lung cancer).  Other medical conditions such as heart failure. HOME CARE INSTRUCTIONS  Pay attention to any changes in your symptoms. Take these actions to help with your discomfort:  Take medicines only as told by your health care provider.  If you were prescribed an antibiotic medicine, take it as told by your health care provider. Do not stop taking the antibiotic even if you start to feel better.  Talk with your health care provider before you take a cough suppressant medicine.  Drink enough fluid to keep your urine clear or pale yellow.  If the air is dry, use a cold steam vaporizer or humidifier in your bedroom or your home to help loosen secretions.  Avoid anything that causes you to cough at work or at home.  If your cough is worse at night, try sleeping in a semi-upright position.  Avoid cigarette smoke. If you smoke, quit smoking. If you need help quitting, ask your health care provider.  Avoid caffeine.  Avoid alcohol.  Rest as needed. SEEK MEDICAL CARE IF:   You have new symptoms.  You cough  up pus.  Your cough does not get better after 2-3 weeks, or your cough gets worse.  You cannot control your cough with suppressant medicines and you are losing sleep.  You develop pain that is getting worse or pain that is not controlled with pain medicines.  You have a fever.  You have unexplained weight loss.  You have night sweats. SEEK IMMEDIATE MEDICAL CARE IF:  You cough up blood.  You have difficulty breathing.  Your heartbeat is very fast.   This information is not intended to replace advice given to you by your health care provider. Make sure you discuss any questions you have with your health care provider.   Document Released: 02/27/2011 Document Revised: 05/22/2015 Document Reviewed: 11/07/2014 Elsevier Interactive Patient Education [employer].

## 2015-11-26 MED ORDER — ALBUTEROL SULFATE (2.5 MG/3ML) 0.083% IN NEBU
2.5000 mg | INHALATION_SOLUTION | Freq: Once | RESPIRATORY_TRACT | Status: AC
  Administered 2015-11-26: 2.5 mg via RESPIRATORY_TRACT

## 2015-11-26 NOTE — Addendum Note (Signed)
Addended by: Minette Headland A on: 11/26/2015 04:45 PM   Modules accepted: Orders

## 2015-12-13 ENCOUNTER — Other Ambulatory Visit: Payer: Self-pay | Admitting: Family Medicine

## 2015-12-16 NOTE — Telephone Encounter (Signed)
Left message for pt to call me back 

## 2015-12-16 NOTE — Telephone Encounter (Signed)
Will call pt.

## 2015-12-16 NOTE — Telephone Encounter (Signed)
Pt called back and does not need this inhaler so i will deny this as he is feeling better

## 2016-04-08 ENCOUNTER — Encounter: Payer: Self-pay | Admitting: Family Medicine

## 2016-04-08 ENCOUNTER — Ambulatory Visit (INDEPENDENT_AMBULATORY_CARE_PROVIDER_SITE_OTHER): Payer: BLUE CROSS/BLUE SHIELD | Admitting: Family Medicine

## 2016-04-08 VITALS — BP 130/80 | HR 79 | Resp 18 | Ht 67.0 in | Wt 190.8 lb

## 2016-04-08 DIAGNOSIS — E669 Obesity, unspecified: Secondary | ICD-10-CM | POA: Diagnosis not present

## 2016-04-08 DIAGNOSIS — Z86010 Personal history of colonic polyps: Secondary | ICD-10-CM | POA: Diagnosis not present

## 2016-04-08 DIAGNOSIS — I10 Essential (primary) hypertension: Secondary | ICD-10-CM

## 2016-04-08 DIAGNOSIS — L720 Epidermal cyst: Secondary | ICD-10-CM

## 2016-04-08 DIAGNOSIS — Z8669 Personal history of other diseases of the nervous system and sense organs: Secondary | ICD-10-CM | POA: Diagnosis not present

## 2016-04-08 DIAGNOSIS — M542 Cervicalgia: Secondary | ICD-10-CM

## 2016-04-08 DIAGNOSIS — Z0000 Encounter for general adult medical examination without abnormal findings: Secondary | ICD-10-CM | POA: Diagnosis not present

## 2016-04-08 DIAGNOSIS — Z860101 Personal history of adenomatous and serrated colon polyps: Secondary | ICD-10-CM | POA: Insufficient documentation

## 2016-04-08 DIAGNOSIS — Z1159 Encounter for screening for other viral diseases: Secondary | ICD-10-CM | POA: Diagnosis not present

## 2016-04-08 LAB — CBC WITH DIFFERENTIAL/PLATELET
BASOS PCT: 1 %
Basophils Absolute: 50 cells/uL (ref 0–200)
Eosinophils Absolute: 250 cells/uL (ref 15–500)
Eosinophils Relative: 5 %
HCT: 43.7 % (ref 38.5–50.0)
Hemoglobin: 15.1 g/dL (ref 13.2–17.1)
LYMPHS PCT: 43 %
Lymphs Abs: 2150 cells/uL (ref 850–3900)
MCH: 31.5 pg (ref 27.0–33.0)
MCHC: 34.6 g/dL (ref 32.0–36.0)
MCV: 91.2 fL (ref 80.0–100.0)
MONO ABS: 550 {cells}/uL (ref 200–950)
MONOS PCT: 11 %
MPV: 10.3 fL (ref 7.5–12.5)
NEUTROS ABS: 2000 {cells}/uL (ref 1500–7800)
Neutrophils Relative %: 40 %
PLATELETS: 236 10*3/uL (ref 140–400)
RBC: 4.79 MIL/uL (ref 4.20–5.80)
RDW: 14.1 % (ref 11.0–15.0)
WBC: 5 10*3/uL (ref 4.0–10.5)

## 2016-04-08 LAB — POCT URINALYSIS DIPSTICK
BILIRUBIN UA: NEGATIVE
GLUCOSE UA: NEGATIVE
KETONES UA: NEGATIVE
Leukocytes, UA: NEGATIVE
Nitrite, UA: NEGATIVE
Protein, UA: NEGATIVE
RBC UA: NEGATIVE
SPEC GRAV UA: 1.025
Urobilinogen, UA: NEGATIVE
pH, UA: 6

## 2016-04-08 MED ORDER — LISINOPRIL-HYDROCHLOROTHIAZIDE 10-12.5 MG PO TABS: 1.0000 | ORAL_TABLET | Freq: Every day | ORAL | 3 refills | Status: DC

## 2016-04-08 NOTE — Patient Instructions (Signed)
Do heat for 20 minutes 3 times per day and gentle stretching. You can also try either Dr. Limmie Patricia or Dr. Rhys Martini

## 2016-04-08 NOTE — Progress Notes (Signed)
Subjective:    Patient ID: Rodney Wade, male    DOB: 24-Jul-1956, 60 y.o.   MRN: [id number]  HPI Here for complete examination. He does have some left neck discomfort. No numbness tingling or weakness associated with this. It apparently is slowly getting better. He also has a lesion present on his left index finger however is giving a very little trouble. He has had previous colonoscopy which did show a polyp. Continues on his blood pressure medication without problems. He is taking a multivitamin. His work is going well. He is in a 15 month relationship with a younger gentleman and this is going well. He is not interested in STD testing. He has a remote history of migraine headache but none in the recent past. He does exercise regularly. His alcohol consumption is minimal. Family and social history as well as health maintenance and immunizations were reviewed. He has no particular concerns or complaints other than as above.   Review of Systems  All other systems reviewed and are negative.      Objective:   Physical Exam BP 130/80 (BP Location: Left Arm, Patient Position: Sitting)   Pulse 79   Resp 18   Ht 5\' 7"  (1.702 m)   Wt 190 lb 12.8 oz (86.5 kg)   BMI 29.88 kg/m   General Appearance:    Alert, cooperative, no distress, appears stated age  Head:    Normocephalic, without obvious abnormality, atraumatic  Eyes:    PERRL, conjunctiva/corneas clear, EOM's intact, fundi    benign  Ears:    Normal TM's and external ear canals  Nose:   Nares normal, mucosa normal, no drainage or sinus   tenderness  Throat:   Lips, mucosa, and tongue normal; teeth and gums normal  Neck:   Supple, no lymphadenopathy;  thyroid:  no   enlargement/tenderness/nodules; no carotid   bruit or JVD  Back:    Spine nontender, no curvature, ROM normal, no CVA     tenderness  Lungs:     Clear to auscultation bilaterally without wheezes, rales or     ronchi; respirations unlabored      Heart:     Regular rate and rhythm, S1 and S2 normal, no murmur, rub   or gallop     Abdomen:     Soft, non-tender, nondistended, normoactive bowel sounds,    no masses, no hepatosplenomegaly  Genitalia:    Normal male external genitalia without lesions.  Testicles without masses.  No inguinal hernias.     Extremities:   No clubbing, cyanosis or edema  Pulses:   2+ and symmetric all extremities  Skin:   Skin color, texture, turgor normal, no rashes, clear cystic lesion is noted on the lateral aspect of the index finger.   Lymph nodes:   Cervical, supraclavicular, and axillary nodes normal  Neurologic:   CNII-XII intact, normal strength, sensation and gait; reflexes 2+ and symmetric throughout          Psych:   Normal mood, affect, hygiene and grooming.          Assessment & Plan:  Annual physical exam - Plan: Visual acuity screening, Urinalysis Dipstick, CBC with Differential/Platelet, Comprehensive metabolic panel, Lipid panel  Essential hypertension - Plan: CBC with Differential/Platelet, Comprehensive metabolic panel, lisinopril-hydrochlorothiazide (PRINZIDE,ZESTORETIC) 10-12.5 MG tablet  History of migraine headaches  Obesity (BMI 30-39.9) - Plan: CBC with Differential/Platelet, Comprehensive metabolic panel, Lipid panel  Epidermal inclusion cyst  Need for hepatitis C screening test - Plan:  Hepatitis C antibody  Hx of adenomatous colonic polyps Recommended heat and stretching. Also discussed possible chiropractic manipulation for the neck pain. I+D of the cystic lesion was accomplished without difficulty. Clear gelatinous material was expressed. A compression Band-Aid was applied. He will continue on his other medications. I also discussed migraine headache and management with him especially since he has an aura. Recommended 800 mg of ibuprofen immediately with the onset of the aura.

## 2016-04-09 LAB — LIPID PANEL
CHOL/HDL RATIO: 2.9 ratio (ref <=5.0)
CHOLESTEROL: 178 mg/dL (ref 125–200)
HDL: 61 mg/dL (ref >=40)
LDL Cholesterol: 96 mg/dL (ref <130)
Triglycerides: 105 mg/dL (ref <150)
VLDL: 21 mg/dL (ref <30)

## 2016-04-09 LAB — COMPREHENSIVE METABOLIC PANEL
ALT: 22 U/L (ref 9–46)
AST: 24 U/L (ref 10–35)
Albumin: 4.7 g/dL (ref 3.6–5.1)
Alkaline Phosphatase: 40 U/L (ref 40–115)
BILIRUBIN TOTAL: 0.6 mg/dL (ref 0.2–1.2)
BUN: 17 mg/dL (ref 7–25)
CO2: 26 mmol/L (ref 20–31)
CREATININE: 0.98 mg/dL (ref 0.70–1.25)
Calcium: 9.4 mg/dL (ref 8.6–10.3)
Chloride: 102 mmol/L (ref 98–110)
Glucose, Bld: 98 mg/dL (ref 65–99)
Potassium: 4.4 mmol/L (ref 3.5–5.3)
SODIUM: 140 mmol/L (ref 135–146)
TOTAL PROTEIN: 7.2 g/dL (ref 6.1–8.1)

## 2016-04-09 LAB — HEPATITIS C ANTIBODY: HCV AB: NEGATIVE

## 2016-05-23 ENCOUNTER — Other Ambulatory Visit: Payer: Self-pay | Admitting: Family Medicine

## 2016-07-20 ENCOUNTER — Ambulatory Visit (INDEPENDENT_AMBULATORY_CARE_PROVIDER_SITE_OTHER): Payer: BC Managed Care – PPO | Admitting: Family Medicine

## 2016-07-20 ENCOUNTER — Encounter: Payer: Self-pay | Admitting: Family Medicine

## 2016-07-20 VITALS — BP 130/70 | HR 76 | Resp 16 | Wt 195.4 lb

## 2016-07-20 DIAGNOSIS — S39012A Strain of muscle, fascia and tendon of lower back, initial encounter: Secondary | ICD-10-CM

## 2016-07-20 DIAGNOSIS — K219 Gastro-esophageal reflux disease without esophagitis: Secondary | ICD-10-CM

## 2016-07-20 NOTE — Progress Notes (Signed)
   Subjective:    Patient ID: Rodney Wade, male    DOB: Dec 11, 1955, 60 y.o.   MRN: [id number]  HPI Saturday he did a lot of outside work with bending involved. One hour after finishing the work he did develop some low back pain. The pain did not radiate and he did not having numbness, tingling or weakness. He was able to do his normal ADLs. He has been taking Advil 4 tablets 3 or 4 times per day. He also complains of a one-year history of intermittent mid chest burning with radiation into his neck that lasts just 15 seconds. He cannot relate this to food or position. There is no associated nausea, vomiting, diaphoresis, shortness of breath or chest pressure. No family history of heart disease and no previous history of heart trouble. He does not smoke.   Review of Systems     Objective:   Physical Exam Alert and in no distress. Tympanic membranes and canals are normal. Pharyngeal area is normal. Neck is supple without adenopathy or thyromegaly. Cardiac exam shows a regular sinus rhythm without murmurs or gallops. Lungs are clear to auscultation. Back exam shows no tenderness with normal lumbar curve and motion. Negative straight leg raising. Normal hip motion.SI  joints are nontender.      Assessment & Plan:  Back strain, initial encounter  Gastroesophageal reflux disease without esophagitis Recommend conservative care for the back with heat, stretching, range of motion. Also demonstrated proper posturing. Recommend he use Maalox or Mylanta liquid and see what effect this has also he is to keep track of when he is indeed having symptoms a see if he can relate this to anything in particular.

## 2016-07-20 NOTE — Patient Instructions (Signed)
The next time you have the indigestion try some liquid Maalox or Mylanta which should give you quick relief. Attention to see if food or position plays a role. For the back its basic care with heat for 20 minutes 3 times per day and gentle stretching after that and Advil 4 tablets 3 times a day psychiatrist abnormalities there is

## 2016-11-03 ENCOUNTER — Ambulatory Visit (HOSPITAL_COMMUNITY)
Admission: EM | Admit: 2016-11-03 | Discharge: 2016-11-03 | Disposition: A | Discharge status: Home / Self Care | Payer: BC Managed Care – PPO | Attending: Internal Medicine | Admitting: Internal Medicine

## 2016-11-03 ENCOUNTER — Encounter (HOSPITAL_COMMUNITY): Payer: Self-pay | Admitting: Family Medicine

## 2016-11-03 DIAGNOSIS — R69 Illness, unspecified: Secondary | ICD-10-CM | POA: Diagnosis not present

## 2016-11-03 DIAGNOSIS — J111 Influenza due to unidentified influenza virus with other respiratory manifestations: Secondary | ICD-10-CM

## 2016-11-03 MED ORDER — HYDROCOD POLST-CPM POLST ER 10-8 MG/5ML PO SUER: 5.0000 mL | Freq: Every evening | ORAL | 0 refills | Status: DC | PRN

## 2016-11-03 MED ORDER — OSELTAMIVIR PHOSPHATE 75 MG PO CAPS: 75.0000 mg | ORAL_CAPSULE | Freq: Two times a day (BID) | ORAL | 0 refills | Status: DC

## 2016-11-03 NOTE — ED Provider Notes (Signed)
[facility]    CSN: [id number] Arrival date & time: 11/03/16  [place]     History   Chief Complaint Chief Complaint  Patient presents with  . URI    HPI Rodney Wade is a 61 y.o. male.   C/o 4 days of sore throat, cough, congestion, and general malaise.  Took his temperature today and had a fever.        Past Medical History:  Diagnosis Date  . Gout   . HH (hiatus hernia)   . History of benign essential tremor   . Hypertension   . Migraine headache     Patient Active Problem List   Diagnosis Date Noted  . Hx of adenomatous colonic polyps 04/08/2016  . Obesity (BMI 30-39.9) 04/04/2012  . Hypertension 09/21/2011  . History of migraine headaches 09/21/2011    Past Surgical History:  Procedure Laterality Date  . bone spur removal Right 2006  . GANGLION CYST EXCISION         Home Medications    Prior to Admission medications   Medication Sig Start Date End Date Taking? Authorizing Provider  ALPRAZolam (XANAX) 0.25 MG tablet Take 1 tablet (0.25 mg total) by mouth 2 (two) times daily as needed for anxiety. Patient not taking: Reported on 07/20/2016 10/17/14   Denita Lung, MD  aspirin 81 MG tablet Take 81 mg by mouth daily.      Historical Provider, MD  chlorpheniramine-HYDROcodone (TUSSIONEX PENNKINETIC ER) 10-8 MG/5ML SUER Take 5 mLs by mouth at bedtime as needed for cough. 11/03/16   Harrie Foreman, MD  ibuprofen (ADVIL,MOTRIN) 200 MG tablet Take 200 mg by mouth every 6 (six) hours as needed (pain.).    Historical Provider, MD  lisinopril-hydrochlorothiazide (PRINZIDE,ZESTORETIC) 10-12.5 MG tablet Take 1 tablet by mouth daily. 04/08/16   Denita Lung, MD  Multiple Vitamin (MULTIVITAMIN WITH MINERALS) TABS tablet Take 1 tablet by mouth daily.    Historical Provider, MD  oseltamivir (TAMIFLU) 75 MG capsule Take 1 capsule (75 mg total) by mouth every 12 (twelve) hours. 11/03/16   Harrie Foreman, MD    Family History Family History    Problem Relation Age of Onset  . Hypertension Mother   . Hypertension Father   . Colon cancer Neg Hx     Social History Social History  Substance Use Topics  . Smoking status: Never Smoker  . Smokeless tobacco: Never Used  . Alcohol use 2.4 oz/week    4 Glasses of wine per week     Comment: occasional     Allergies   Patient has no known allergies.   Review of Systems Review of Systems  Constitutional: Positive for fever. Negative for chills.  HENT: Positive for congestion and sore throat. Negative for tinnitus.   Eyes: Negative for redness.  Respiratory: Positive for cough. Negative for shortness of breath.   Cardiovascular: Negative for chest pain and palpitations.  Gastrointestinal: Negative for abdominal pain, diarrhea, nausea and vomiting.  Genitourinary: Negative for dysuria, frequency and urgency.  Musculoskeletal: Negative for myalgias.  Skin: Negative for rash.       No lesions  Neurological: Negative for weakness.  Hematological: Does not bruise/bleed easily.  Psychiatric/Behavioral: Negative for suicidal ideas.     Physical Exam Triage Vital Signs ED Triage Vitals [11/03/16 1835]  Enc Vitals Group     BP 147/96     Pulse Rate 93     Resp 18     Temp 99.3 F (37.4  C)     Temp src      SpO2 97 %     Weight      Height      Head Circumference      Peak Flow      Pain Score      Pain Loc      Pain Edu?      Excl. in [place]?    No data found.   Updated Vital Signs BP 147/96   Pulse 93   Temp 99.3 F (37.4 C)   Resp 18   SpO2 97%   Visual Acuity Right Eye Distance:   Left Eye Distance:   Bilateral Distance:    Right Eye Near:   Left Eye Near:    Bilateral Near:     Physical Exam  Constitutional: He is oriented to person, place, and time. He appears well-developed and well-nourished. No distress.  HENT:  Head: Normocephalic and atraumatic.  Mouth/Throat: Oropharynx is clear and moist.  Eyes: Conjunctivae and EOM are normal. Pupils  are equal, round, and reactive to light. No scleral icterus.  Neck: Normal range of motion. Neck supple. No JVD present. No tracheal deviation present. No thyromegaly present.  Cardiovascular: Normal rate, regular rhythm and normal heart sounds.  Exam reveals no gallop and no friction rub.   No murmur heard. Pulmonary/Chest: Effort normal and breath sounds normal. No respiratory distress.  Abdominal: Soft. Bowel sounds are normal. He exhibits no distension. There is no tenderness.  Musculoskeletal: Normal range of motion. He exhibits no edema.  Lymphadenopathy:    He has no cervical adenopathy.  Neurological: He is alert and oriented to person, place, and time. No cranial nerve deficit.  Skin: Skin is warm and dry. No rash noted. No erythema.  Psychiatric: He has a normal mood and affect. His behavior is normal. Judgment and thought content normal.     UC Treatments / Results  Labs (all labs ordered are listed, but only abnormal results are displayed) Labs Reviewed - No data to display  EKG  EKG Interpretation None       Radiology No results found.  Procedures Procedures (including critical care time)  Medications Ordered in UC Medications - No data to display   Initial Impression / Assessment and Plan / UC Course  I have reviewed the triage vital signs and the nursing notes.  Pertinent labs & imaging results that were available during my care of the patient were reviewed by me and considered in my medical decision making (see chart for details).     Tx empirically for flu.   Final Clinical Impressions(s) / UC Diagnoses   Final diagnoses:  Influenza-like illness    New Prescriptions New Prescriptions   CHLORPHENIRAMINE-HYDROCODONE (TUSSIONEX PENNKINETIC ER) 10-8 MG/5ML SUER    Take 5 mLs by mouth at bedtime as needed for cough.   OSELTAMIVIR (TAMIFLU) 75 MG CAPSULE    Take 1 capsule (75 mg total) by mouth every 12 (twelve) hours.     Harrie Foreman,  MD 11/03/16 [phone]

## 2016-11-03 NOTE — ED Triage Notes (Signed)
Pt here for URI symptoms since yesterday.  

## 2017-02-26 ENCOUNTER — Emergency Department (HOSPITAL_COMMUNITY)
Admission: EM | Admit: 2017-02-26 | Discharge: 2017-02-26 | Disposition: A | Discharge status: Home / Self Care | Payer: BC Managed Care – PPO | Attending: Emergency Medicine | Admitting: Emergency Medicine

## 2017-02-26 ENCOUNTER — Encounter (HOSPITAL_COMMUNITY): Payer: Self-pay | Admitting: Nurse Practitioner

## 2017-02-26 DIAGNOSIS — I10 Essential (primary) hypertension: Secondary | ICD-10-CM | POA: Diagnosis not present

## 2017-02-26 DIAGNOSIS — Z7982 Long term (current) use of aspirin: Secondary | ICD-10-CM | POA: Insufficient documentation

## 2017-02-26 DIAGNOSIS — Y929 Unspecified place or not applicable: Secondary | ICD-10-CM | POA: Insufficient documentation

## 2017-02-26 DIAGNOSIS — Y999 Unspecified external cause status: Secondary | ICD-10-CM | POA: Diagnosis not present

## 2017-02-26 DIAGNOSIS — Y9389 Activity, other specified: Secondary | ICD-10-CM | POA: Diagnosis not present

## 2017-02-26 DIAGNOSIS — R600 Localized edema: Secondary | ICD-10-CM | POA: Diagnosis present

## 2017-02-26 DIAGNOSIS — T783XXA Angioneurotic edema, initial encounter: Secondary | ICD-10-CM

## 2017-02-26 MED ORDER — DEXAMETHASONE 4 MG PO TABS
12.0000 mg | ORAL_TABLET | Freq: Once | ORAL | Status: AC
  Administered 2017-02-26: 12 mg via ORAL
  Filled 2017-02-26: qty 3

## 2017-02-26 MED ORDER — DIPHENHYDRAMINE HCL 25 MG PO CAPS
25.0000 mg | ORAL_CAPSULE | Freq: Once | ORAL | Status: AC
  Administered 2017-02-26: 25 mg via ORAL
  Filled 2017-02-26: qty 1

## 2017-02-26 MED ORDER — AMLODIPINE BESYLATE 5 MG PO TABS: 5.0000 mg | ORAL_TABLET | Freq: Every day | ORAL | 0 refills | Status: DC

## 2017-02-26 NOTE — ED Triage Notes (Signed)
Pt presents with an upper lip swelling, endorses being lisinopril but states he has taking it for at least 10 years. Airway and Breathing unremarkable.

## 2017-02-26 NOTE — ED Notes (Addendum)
Pt c/o swelling in the upper lip with numbness onset 16:00 today. Denies difficulty breathing, no swelling in the tongue, denies pain. Pt took benadryl around 19:30 with no effect. No known allergies and stated that he hasn't eaten anything out of the ordinary. Pt stated hx ideopathic swelling in the fingers and pads of the hands.

## 2017-03-02 ENCOUNTER — Encounter: Payer: Self-pay | Admitting: Family Medicine

## 2017-03-02 ENCOUNTER — Ambulatory Visit (INDEPENDENT_AMBULATORY_CARE_PROVIDER_SITE_OTHER): Payer: BC Managed Care – PPO | Admitting: Family Medicine

## 2017-03-02 VITALS — BP 120/72 | HR 86 | Wt 193.0 lb

## 2017-03-02 DIAGNOSIS — Z23 Encounter for immunization: Secondary | ICD-10-CM | POA: Diagnosis not present

## 2017-03-02 DIAGNOSIS — I10 Essential (primary) hypertension: Secondary | ICD-10-CM | POA: Diagnosis not present

## 2017-03-02 MED ORDER — AMLODIPINE BESYLATE 5 MG PO TABS: 5.0000 mg | ORAL_TABLET | Freq: Every day | ORAL | 0 refills | Status: DC

## 2017-03-02 NOTE — Progress Notes (Signed)
   Subjective:    Patient ID: Rodney Wade, male    DOB: September 01, 1956, 61 y.o.   MRN: [id number]  HPI He was seen June 15 in the emergency room and evaluated for angioedema. His ACE inhibitor was stopped and he was placed on amlodipine. He is here for follow-up on this. Presently he is having no swelling/edema and is tolerating the medication.   Review of Systems     Objective:   Physical Exam alert and in no distress. Blood pressure is recorded.      Assessment & Plan:  Essential hypertension - Plan: amLODipine (NORVASC) 5 MG tablet, DISCONTINUED: amLODipine (NORVASC) 5 MG tablet  Need for vaccination against Streptococcus pneumoniae - Plan: Varicella-zoster vaccine IM (Shingrix) I will continue him on the Norvasc. He is to return here in one month for recheck on that. Also Shingrix given. He will return here in 2 months for recheck.

## 2017-03-08 NOTE — ED Provider Notes (Signed)
[facility] DEPT Provider Note   CSN: [id number] Arrival date & time: 02/26/17  2138     History   Chief Complaint Chief Complaint  Patient presents with  . Oral Swelling    HPI Rodney Wade is a 61 y.o. male.  HPI   [name] with upper lip swelling. First noticed around 1600 today. Symptoms stable to perhaps mildly improving. No difficulty breathing or swallowing. No GI complaints. No cough or wheezing. No dizziness or lightheadedness. No new exposures that she is aware. Has been on an ACEI for years.   Past Medical History:  Diagnosis Date  . Gout   . HH (hiatus hernia)   . History of benign essential tremor   . Hypertension   . Migraine headache     Patient Active Problem List   Diagnosis Date Noted  . Hx of adenomatous colonic polyps 04/08/2016  . Obesity (BMI 30-39.9) 04/04/2012  . Hypertension 09/21/2011  . History of migraine headaches 09/21/2011    Past Surgical History:  Procedure Laterality Date  . bone spur removal Right 2006  . GANGLION CYST EXCISION         Home Medications    Prior to Admission medications   Medication Sig Start Date End Date Taking? Authorizing Provider  aspirin 81 MG tablet Take 81 mg by mouth daily.     Yes [provider]  ibuprofen (ADVIL,MOTRIN) 200 MG tablet Take 200 mg by mouth every 6 (six) hours as needed (pain.).   Yes [provider]  Multiple Vitamin (MULTIVITAMIN WITH MINERALS) TABS tablet Take 1 tablet by mouth daily.   Yes [provider]  amLODipine (NORVASC) 5 MG tablet Take 1 tablet (5 mg total) by mouth daily. 03/02/17   Denita Lung, MD    Family History Family History  Problem Relation Age of Onset  . Hypertension Mother   . Hypertension Father   . Colon cancer Neg Hx     Social History Social History  Substance Use Topics  . Smoking status: Never Smoker  . Smokeless tobacco: Never Used  . Alcohol use 2.4 oz/week    4 Glasses of wine per week   Comment: occasional     Allergies   Ace inhibitors   Review of Systems Review of Systems  All systems reviewed and negative, other than as noted in HPI.  Physical Exam Updated Vital Signs BP (!) 140/91   Pulse 84   Temp 97.9 F (36.6 C) (Oral)   Resp 16   Ht 5\' 10"  (1.778 m)   Wt 88.5 kg (195 lb)   SpO2 100%   BMI 27.98 kg/m   Physical Exam  Constitutional: He appears well-developed and well-nourished. No distress.  HENT:  Head: Normocephalic.  Mild symmetric swelling upper lip. Oropharynx clear. Normal sounding voice. No stridor. Neck supple.   Eyes: Conjunctivae are normal. Right eye exhibits no discharge. Left eye exhibits no discharge.  Neck: Neck supple.  Cardiovascular: Normal rate, regular rhythm and normal heart sounds.  Exam reveals no gallop and no friction rub.   No murmur heard. Pulmonary/Chest: Effort normal and breath sounds normal. No respiratory distress.  Abdominal: Soft. He exhibits no distension. There is no tenderness.  Musculoskeletal: He exhibits no edema or tenderness.  Neurological: He is alert.  Skin: Skin is warm and dry.  Psychiatric: He has a normal mood and affect. His behavior is normal. Thought content normal.  Nursing note and vitals reviewed.    ED Treatments / Results  Labs (all labs ordered are listed, but only abnormal results are displayed) Labs Reviewed - No data to display  EKG  EKG Interpretation None       Radiology No results found.  Procedures Procedures (including critical care time)  Medications Ordered in ED Medications  dexamethasone (DECADRON) tablet 12 mg (12 mg Oral Given 02/26/17 2342)  diphenhydrAMINE (BENADRYL) capsule 25 mg (25 mg Oral Given 02/26/17 2342)     Initial Impression / Assessment and Plan / ED Course  I have reviewed the triage vital signs and the nursing notes.  Pertinent labs & imaging results that were available during my care of the patient were reviewed by me and considered in  my medical decision making (see chart for details).     [name] with lip swelling. On ACEI for years. Symptoms have been stable for hours. Advised to stop. It has been determined that no acute conditions requiring further emergency intervention are present at this time. The patient has been advised of the diagnosis and plan. I reviewed any labs and imaging including any potential incidental findings. We have discussed signs and symptoms that warrant return to the ED and they are listed in the discharge instructions.    Final Clinical Impressions(s) / ED Diagnoses   Final diagnoses:  Angioedema, initial encounter    New Prescriptions Discharge Medication List as of 02/26/2017 11:26 PM    START taking these medications   Details  amLODipine (NORVASC) 5 MG tablet Take 1 tablet (5 mg total) by mouth daily., Starting Fri 02/26/2017, Print         Virgel Manifold, MD 03/09/17 [phone]

## 2017-04-13 ENCOUNTER — Ambulatory Visit: Payer: BC Managed Care – PPO | Admitting: Family Medicine

## 2017-05-14 ENCOUNTER — Ambulatory Visit (INDEPENDENT_AMBULATORY_CARE_PROVIDER_SITE_OTHER): Payer: BC Managed Care – PPO | Admitting: Family Medicine

## 2017-05-14 ENCOUNTER — Encounter: Payer: Self-pay | Admitting: Family Medicine

## 2017-05-14 VITALS — BP 138/84 | HR 70 | Temp 98.0°F | Resp 16

## 2017-05-14 DIAGNOSIS — T783XXD Angioneurotic edema, subsequent encounter: Secondary | ICD-10-CM | POA: Diagnosis not present

## 2017-05-14 MED ORDER — PREDNISONE 10 MG (21) PO TBPK: ORAL_TABLET | Freq: Every day | ORAL | 0 refills | Status: DC

## 2017-05-14 MED ORDER — EPINEPHRINE 0.3 MG/0.3ML IJ SOAJ: 0.3000 mg | Freq: Once | INTRAMUSCULAR | 0 refills | Status: AC

## 2017-05-14 MED ORDER — METHYLPREDNISOLONE ACETATE 80 MG/ML IJ SUSP
80.0000 mg | Freq: Once | INTRAMUSCULAR | Status: AC
  Administered 2017-05-14: 80 mg via INTRAMUSCULAR

## 2017-05-14 NOTE — Progress Notes (Signed)
   Subjective:    Patient ID: Natalie Leclaire, male    DOB: 09/26/1955, 61 y.o.   MRN: [id number]  HPI Chief Complaint  Patient presents with  . angioedema    angioedema for the last 12 hours. last month was in ER for upper lid and got bp meds.    He is here with complaints of lower lip edema that started 12 hours ago and has been gradually improving. States he put ice on his lower lip, did not have benadryl.   He was seen and treated for angioedema in the ED on 02/26/2017. His ace inhibitor was stopped and he is now on amlodipine for HTN. Denies any oral edema since his visit in June.   Denies fever, chills, tongue or throat swelling, chest pain, palpitations, shortness of breath, difficulty swallowing, N/V/D.   No new foods or medications.  No known hereditary angioedema.   Reviewed allergies, medications, past medical, surgical, family, and social history.  Review of Systems Pertinent positives and negatives in the history of present illness.     Objective:   Physical Exam  Constitutional: He is oriented to person, place, and time. He appears well-developed and well-nourished. No distress.  HENT:  Mouth/Throat: Uvula is midline, oropharynx is clear and moist and mucous membranes are normal. No uvula swelling. No posterior oropharyngeal edema.  Lower lip edema - mild  Eyes: Pupils are equal, round, and reactive to light. Conjunctivae and lids are normal.  Neck: Trachea normal and full passive range of motion without pain. Neck supple.  Cardiovascular: Normal rate, regular rhythm, normal heart sounds and normal pulses.   Pulmonary/Chest: Effort normal and breath sounds normal. No stridor.  Lymphadenopathy:    He has no cervical adenopathy.  Neurological: He is alert and oriented to person, place, and time. He has normal strength. Coordination and gait normal.  Skin: Skin is warm and dry. No rash noted. No pallor.  Psychiatric: He has a normal mood and affect. His speech  is normal and behavior is normal. Thought content normal.   BP 138/84   Pulse 70   Temp 98 F (36.7 C) (Oral)   Resp 16   SpO2 98%       Assessment & Plan:  Angioedema of lips, subsequent encounter - Plan: EPINEPHrine 0.3 mg/0.3 mL IJ SOAJ injection, predniSONE (STERAPRED UNI-PAK 21 TAB) 10 MG (21) TBPK tablet, Ambulatory referral to Allergy, methylPREDNISolone acetate (DEPO-MEDROL) injection 80 mg  He is not in any distress. Reports lower lip edema has been gradually improving over the past 6-12 hours.   Depo medrol 80 mg given in office. He declines Benadryl and states it makes him sleepy and he needs to go to work.  Recommend he take Benadryl and Pepcid twice daily for the next 3 days.  Prescribed steroid dose pak and epi pen. He will start steroid if symptoms do not resolve by tomorrow.  Advised on strict precautions to call 911 or go to the ED if symptoms worsen. He verbalized understanding.  Discussed referral to allergist vs waiting and he would prefer to be referred now.

## 2017-05-14 NOTE — Patient Instructions (Signed)
Take Benadryl 1-2 tablets (25 -50mg ) twice daily and pepcid 1 tablet twice daily also for the next 3 days.   I prescribed an epipen and steroid dose pack.   If your symptoms get worse then call 911 or go to the emergency room.   I am referring you to an allergist for further evaluation of recurrent angioedema.

## 2017-05-27 ENCOUNTER — Telehealth: Payer: Self-pay

## 2017-05-27 ENCOUNTER — Ambulatory Visit (INDEPENDENT_AMBULATORY_CARE_PROVIDER_SITE_OTHER): Payer: BC Managed Care – PPO | Admitting: Family Medicine

## 2017-05-27 ENCOUNTER — Encounter: Payer: Self-pay | Admitting: Family Medicine

## 2017-05-27 VITALS — BP 130/68 | HR 86 | Resp 16 | Wt 194.6 lb

## 2017-05-27 DIAGNOSIS — Z0000 Encounter for general adult medical examination without abnormal findings: Secondary | ICD-10-CM

## 2017-05-27 DIAGNOSIS — I10 Essential (primary) hypertension: Secondary | ICD-10-CM | POA: Diagnosis not present

## 2017-05-27 DIAGNOSIS — E669 Obesity, unspecified: Secondary | ICD-10-CM

## 2017-05-27 DIAGNOSIS — Z87898 Personal history of other specified conditions: Secondary | ICD-10-CM | POA: Diagnosis not present

## 2017-05-27 DIAGNOSIS — T464X5A Adverse effect of angiotensin-converting-enzyme inhibitors, initial encounter: Secondary | ICD-10-CM | POA: Insufficient documentation

## 2017-05-27 DIAGNOSIS — Z23 Encounter for immunization: Secondary | ICD-10-CM

## 2017-05-27 DIAGNOSIS — R011 Cardiac murmur, unspecified: Secondary | ICD-10-CM | POA: Diagnosis not present

## 2017-05-27 DIAGNOSIS — T783XXA Angioneurotic edema, initial encounter: Secondary | ICD-10-CM

## 2017-05-27 DIAGNOSIS — Z86010 Personal history of colonic polyps: Secondary | ICD-10-CM

## 2017-05-27 LAB — CBC WITH DIFFERENTIAL/PLATELET
BASOS ABS: 59 {cells}/uL (ref 0–200)
Basophils Relative: 0.9 %
EOS PCT: 3.5 %
Eosinophils Absolute: 231 cells/uL (ref 15–500)
HEMATOCRIT: 42 % (ref 38.5–50.0)
Hemoglobin: 14.4 g/dL (ref 13.2–17.1)
LYMPHS ABS: 2752 {cells}/uL (ref 850–3900)
MCH: 30.9 pg (ref 27.0–33.0)
MCHC: 34.3 g/dL (ref 32.0–36.0)
MCV: 90.1 fL (ref 80.0–100.0)
MPV: 10.6 fL (ref 7.5–12.5)
Monocytes Relative: 8.2 %
NEUTROS PCT: 45.7 %
Neutro Abs: 3016 cells/uL (ref 1500–7800)
Platelets: 239 10*3/uL (ref 140–400)
RBC: 4.66 10*6/uL (ref 4.20–5.80)
RDW: 13.1 % (ref 11.0–15.0)
Total Lymphocyte: 41.7 %
WBC mixed population: 541 cells/uL (ref 200–950)
WBC: 6.6 10*3/uL (ref 3.8–10.8)

## 2017-05-27 LAB — COMPREHENSIVE METABOLIC PANEL
AG Ratio: 1.8 (calc) (ref 1.0–2.5)
ALKALINE PHOSPHATASE (APISO): 58 U/L (ref 40–115)
ALT: 14 U/L (ref 9–46)
AST: 16 U/L (ref 10–35)
Albumin: 4.4 g/dL (ref 3.6–5.1)
BILIRUBIN TOTAL: 0.4 mg/dL (ref 0.2–1.2)
BUN: 14 mg/dL (ref 7–25)
CALCIUM: 9.3 mg/dL (ref 8.6–10.3)
CO2: 25 mmol/L (ref 20–32)
Chloride: 102 mmol/L (ref 98–110)
Creat: 0.83 mg/dL (ref 0.70–1.25)
Globulin: 2.4 g/dL (calc) (ref 1.9–3.7)
Glucose, Bld: 100 mg/dL — ABNORMAL HIGH (ref 65–99)
Potassium: 4.2 mmol/L (ref 3.5–5.3)
Sodium: 139 mmol/L (ref 135–146)
TOTAL PROTEIN: 6.8 g/dL (ref 6.1–8.1)

## 2017-05-27 LAB — POCT URINALYSIS DIP (PROADVANTAGE DEVICE)
BILIRUBIN UA: NEGATIVE mg/dL
Bilirubin, UA: NEGATIVE
Blood, UA: NEGATIVE
Glucose, UA: NEGATIVE mg/dL
Leukocytes, UA: NEGATIVE
Nitrite, UA: NEGATIVE
PROTEIN UA: NEGATIVE mg/dL
SPECIFIC GRAVITY, URINE: 1.02
Urobilinogen, Ur: NEGATIVE
pH, UA: 6 (ref 5.0–8.0)

## 2017-05-27 LAB — LIPID PANEL
CHOLESTEROL: 157 mg/dL (ref <200)
HDL: 69 mg/dL (ref >40)
LDL CHOLESTEROL (CALC): 73 mg/dL
Non-HDL Cholesterol (Calc): 88 mg/dL (calc) (ref <130)
TRIGLYCERIDES: 70 mg/dL (ref <150)
Total CHOL/HDL Ratio: 2.3 (calc) (ref <5.0)

## 2017-05-27 MED ORDER — AMLODIPINE BESYLATE 5 MG PO TABS: 5.0000 mg | ORAL_TABLET | Freq: Every day | ORAL | 3 refills | Status: DC

## 2017-05-27 MED ORDER — PHENTERMINE HCL 37.5 MG PO CAPS: 37.5000 mg | ORAL_CAPSULE | ORAL | 0 refills | Status: DC

## 2017-05-27 NOTE — Telephone Encounter (Signed)
Called in phentermine to [facility].

## 2017-05-27 NOTE — Progress Notes (Signed)
Subjective:    Patient ID: Rodney Wade, male    DOB: 1956-04-26, 61 y.o.   MRN: [id number]  HPI He is here for complete examination. He has had recent difficulty with angioedema. He relates intermittent difficulty with edema for the last several years. Recently his lisinopril stopped however he still had another episode after that. Presently he is on amlodipine. He also has a previous history of colonic polyps. In the past he had used Adipex to help with weight reduction and would like to do this again. He does keep himself very physically active exercising on a treadmill 5 days per week. No chest pain, shortness breath, PND.. Family and social history as well as health maintenance and immunizations was reviewed. 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   Review of Systems  All other systems reviewed and are negative.      Objective:   Physical Exam BP 130/68   Pulse 86   Resp 16   Wt 194 lb 9.6 oz (88.3 kg)   SpO2 95%   BMI 27.92 kg/m   General Appearance:    Alert, cooperative, no distress, appears stated age  Head:    Normocephalic, without obvious abnormality, atraumatic  Eyes:    PERRL, conjunctiva/corneas clear, EOM's intact, fundi    benign  Ears:    Normal TM's and external ear canals  Nose:   Nares normal, mucosa normal, no drainage or sinus   tenderness  Throat:   Lips, mucosa, and tongue normal; teeth and gums normal  Neck:   Supple, no lymphadenopathy;  thyroid:  no   enlargement/tenderness/nodules; no carotid   bruit or JVD     Lungs:     Clear to auscultation bilaterally without wheezes, rales or     ronchi; respirations unlabored      Heart:    Regular rate and rhythm, S1 and S2 normal, 2/6 SEM noted.   no gallop     Abdomen:     Soft, non-tender, nondistended, normoactive bowel sounds,    no masses, no hepatosplenomegaly  Genitalia:    Normal male external genitalia without lesions.  Testicles without masses.  No  inguinal hernias.  Rectal:  Deferred   Extremities:   No clubbing, cyanosis or edema  Pulses:   2+ and symmetric all extremities  Skin:   Skin color, texture, turgor normal, no rashes or lesions  Lymph nodes:   Cervical, supraclavicular, and axillary nodes normal  Neurologic:   CNII-XII intact, normal strength, sensation and gait; reflexes 2+ and symmetric throughout          Psych:   Normal mood, affect, hygiene and grooming.         Assessment & Plan:  Annual physical exam - Plan: POCT Urinalysis DIP (Proadvantage Device), CBC with Differential/Platelet, Comprehensive metabolic panel, Lipid panel  Need for shingles vaccine - Plan: Varicella-zoster vaccine IM (Shingrix)  Need for influenza vaccination - Plan: Flu Vaccine QUAD 6+ mos PF IM (Fluarix Quad PF)  Essential hypertension - Plan: CBC with Differential/Platelet, Comprehensive metabolic panel, amLODipine (NORVASC) 5 MG tablet  Hx of adenomatous colonic polyps  History of angioedema - Plan: CBC with Differential/Platelet, Comprehensive metabolic panel, Lipid panel  Obesity (BMI 30-39.9) - Plan: CBC with Differential/Platelet, Comprehensive metabolic panel, Lipid panel, phentermine 37.5 MG capsule  Systolic murmur - Plan: ECHOCARDIOGRAM COMPLETE His immunizations were updated. I will give him some Adipex at his request to help jump start him with weight  loss. He will follow-up with allergy concerning the angioedema.

## 2017-06-04 ENCOUNTER — Ambulatory Visit (HOSPITAL_COMMUNITY): Payer: BC Managed Care – PPO | Attending: Cardiology

## 2017-06-04 ENCOUNTER — Other Ambulatory Visit: Payer: Self-pay

## 2017-06-04 DIAGNOSIS — R011 Cardiac murmur, unspecified: Secondary | ICD-10-CM | POA: Diagnosis not present

## 2017-06-04 DIAGNOSIS — I350 Nonrheumatic aortic (valve) stenosis: Secondary | ICD-10-CM | POA: Insufficient documentation

## 2017-06-04 DIAGNOSIS — I10 Essential (primary) hypertension: Secondary | ICD-10-CM | POA: Insufficient documentation

## 2017-06-04 DIAGNOSIS — I359 Nonrheumatic aortic valve disorder, unspecified: Secondary | ICD-10-CM

## 2017-06-06 ENCOUNTER — Telehealth: Payer: Self-pay | Admitting: Medical

## 2017-06-06 NOTE — Telephone Encounter (Signed)
P.A. PHENTERMINE  

## 2017-06-08 ENCOUNTER — Telehealth: Payer: Self-pay | Admitting: Family Medicine

## 2017-06-08 NOTE — Telephone Encounter (Signed)
Pt returned your call [phone]

## 2017-06-08 NOTE — Progress Notes (Unsigned)
I reviewed the echocardiogram with him. Explained that nothing at this point needs to be done other than repeat echo in 1 year and also probable dental prophylaxis.

## 2017-06-13 NOTE — Telephone Encounter (Signed)
P.A. Approved til 08/07/17

## 2017-06-24 NOTE — Telephone Encounter (Signed)
Pt informed

## 2017-07-01 ENCOUNTER — Encounter: Payer: Self-pay | Admitting: Allergy & Immunology

## 2017-07-01 ENCOUNTER — Ambulatory Visit (INDEPENDENT_AMBULATORY_CARE_PROVIDER_SITE_OTHER): Payer: BC Managed Care – PPO | Admitting: Allergy & Immunology

## 2017-07-01 ENCOUNTER — Other Ambulatory Visit: Payer: Self-pay | Admitting: *Deleted

## 2017-07-01 VITALS — BP 136/80 | HR 84 | Resp 16 | Ht 66.5 in | Wt 193.0 lb

## 2017-07-01 DIAGNOSIS — T783XXD Angioneurotic edema, subsequent encounter: Secondary | ICD-10-CM | POA: Diagnosis not present

## 2017-07-01 NOTE — Patient Instructions (Addendum)
1. Angioedema - unknown trigger - We will get some lab work to rule out serious causes of hives and swelling. - I anticipate that this will be normal.  - This could be related to your lisinopril, but typically these reactions occur less frequently and are less serious in general.  - I would recommend taking a daily antihistamine (cetirizine or Zyrtec) to see if this can suppress the reactions.  2. Return in about 3 months (around 10/01/2017).    Please inform us of any Emergency Department visits, hospitalizations, or changes in symptoms. Call us before going to the ED for breathing or allergy symptoms since we might be able to fit you in for a sick visit. Feel free to contact us anytime with any questions, problems, or concerns.  It was a pleasure to meet you today! Enjoy the fall season!  Websites that have reliable patient information: 1. American Academy of Asthma, Allergy, and Immunology: www.aaaai.org 2. Food Allergy Research and Education (FARE): foodallergy.org 3. Mothers of Asthmatics: http://www.asthmacommunitynetwork.org 4. American College of Allergy, Asthma, and Immunology: www.acaai.org   Election Day is coming up on Tuesday, November 6th! Although it is too late to register to vote by mail, you can still register up to November 5th at any of the early voting locations. Try to early vote in case there are problems with your registration!   If you are turned away at the polls, you have the right to request a provisional ballot, which is required by law!      [place]- [place] (open 8am - 5pm) First Floor [place], [place] (open [place]) [place], [place] (open 7am - 7pm)  [place], [employer] (open 7am - 7pm) [address], [employer] (open 7am - 7pm) [address] [place], [employer] (open 7am -  7pm) [place], [place] (open [place]) [place], [place] (open 7am - 7pm) [place], [employer] (open 7am - 7pm) [place], [place]

## 2017-07-01 NOTE — Progress Notes (Signed)
NEW PATIENT  Date of Service/Encounter:  07/01/17  Referring provider: Denita Lung, MD   Assessment:   Angioedema - with a history of ACE inhibitor use  Seasonal allergic rhinitis  Plan/Recommendations:   1. Angioedema - unknown trigger - We will get some lab work to rule out serious causes of hives and swelling: C1 esterase level, C1 esterase function, C1q level, C4 level, and an environmental allergy panel - I anticipate that this will be normal.  - This could be related to your lisinopril, but typically these reactions occur less frequently and are less serious in general.  - I would recommend taking a daily antihistamine (cetirizine or Zyrtec) to see if this can suppress the reactions. - Reassuring signs in his history are the relatively short duration (<24hrs) and the improvement with antihistamines. - Typically hereditary angioedema and acquired angioedema last longer (2-4 days) and do not improve with antihistamines. - Rarely, chronic urticaria can present - ironically - without visible hives.  - We deferred testing due to a lack of clinical indication for it, as I did not want to increase his healthcare costs. - However, if these symptoms continue to be a problem, we can certainly consider testing at the next visit.  - We could consider doing food allergy testing specifically at the next visit, although there was no clear correlation between foods and swelling episodes.   2. Return in about 3 months (around 10/01/2017).  Subjective:   Rodney Wade is a 61 y.o. male presenting today for evaluation of  Chief Complaint  Patient presents with  . Angioedema    getting worse 2 yrs ago onset    Rodney Wade has a history of the following: Patient Active Problem List   Diagnosis Date Noted  . History of angioedema 05/27/2017  . Hx of adenomatous colonic polyps 04/08/2016  . Obesity (BMI 30-39.9) 04/04/2012  . Hypertension 09/21/2011  . History  of migraine headaches 09/21/2011    History obtained from: chart review and patient.  Rodney Wade was referred by Rodney Lung, MD.     Rodney Wade is a 61 y.o. male presenting for an evaluation of angioedema. He has had 2-3 years of swelling. He is unsure of the trigger. It has gotten worse in frequency and intensity. They would happen on the pads of the fingers and on the foot. They would resolve quickly. Now they are occurring on the lips and tongue. He estimates that they are happening fairly quickly.   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 He did go into the ED in June 2018 and his ACE inhibitor was to blame. The frequency has spaced out, but the episodes have been more severe. He stopped the lisinopril. He was changed to Norvasc. He did well for 8 weeks and then had two outbreaks over the weekend. They take 8 to 24 hours to fully develop and then completely resolve. It never went to his throat. The last three episodes have been full blown lip swelling.   He never gets hives, but interestingly it does become pruritic. He will try taking Benadryl and thinks that it might help. He does have an EpiPen but has never had to use it. He never had tongue involvement seriously enough for the breathing to be compromised. He denies stomach pain. He has been given steroid injections.   There is no family history of swelling. There is no food trigger that seems to be related to this at  all. It never wakens him from sleep. He tolerates all of the major food allergens without adverse event. He did start dating his current boyfriend around 2.5 years ago when this all started, but he does not think this is related to his swelling episodes. His boyfriend works in a Company secretary, but he washed up before they spend time together. There is no history of STDs and Rodney Wade is regularly checked for this.   He will occasionally get a runny nose, but otherwise environmental allergens are not a prominent  part of his life. He has never needed an inhaler. Otherwise, there is no history of other atopic diseases, including asthma, drug allergies, food allergies, environmental allergies, or stinging insect allergies. There is no significant infectious history. Vaccinations are up to date.    Past Medical History: Patient Active Problem List   Diagnosis Date Noted  . History of angioedema 05/27/2017  . Hx of adenomatous colonic polyps 04/08/2016  . Obesity (BMI 30-39.9) 04/04/2012  . Hypertension 09/21/2011  . History of migraine headaches 09/21/2011    Medication List:  Allergies as of 07/01/2017      Reactions   Ace Inhibitors Swelling      Medication List       Accurate as of 07/01/17 11:59 PM. Always use your most recent med list.          amLODipine 5 MG tablet Commonly known as:  NORVASC Take 1 tablet (5 mg total) by mouth daily.   aspirin 81 MG tablet Take 81 mg by mouth daily.   EPINEPHrine 0.3 mg/0.3 mL Soaj injection Commonly known as:  EPI-PEN   ibuprofen 200 MG tablet Commonly known as:  ADVIL,MOTRIN Take 200 mg by mouth every 6 (six) hours as needed (pain.).   multivitamin with minerals Tabs tablet Take 1 tablet by mouth daily.   phentermine 37.5 MG capsule Take 1 capsule (37.5 mg total) by mouth every morning.       Birth History: non-contributory. Born at term without complications.   Developmental History: non-contributory.   Past Surgical History: Past Surgical History:  Procedure Laterality Date  . bone spur removal Right 2006  . GANGLION CYST EXCISION       Family History: Family History  Problem Relation Age of Onset  . Hypertension Mother   . Hypertension Father   . Colon cancer Neg Hx   . Allergic rhinitis Neg Hx   . Angioedema Neg Hx   . Asthma Neg Hx   . Eczema Neg Hx      Social History: Rodney Wade lives in a 61yo condominium. He has laminate floor and carpeting throughout the home. He has electric heating and central cooling.  There are no animals inside or outside of the home. There are no dust mite coverings on the bedding. There is no tobacco exposure. 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 He is not a smoker.      Review of Systems: a 14-point review of systems is pertinent for what is mentioned in HPI.  Otherwise, all other systems were negative. Constitutional: negative other than that listed in the HPI Eyes: negative other than that listed in the HPI Ears, nose, mouth, throat, and face: negative other than that listed in the HPI Respiratory: negative other than that listed in the HPI Cardiovascular: negative other than that listed in the HPI Gastrointestinal: negative other than that listed in the HPI  Genitourinary: negative other than that listed in the HPI Integument: negative other than that listed in the HPI Hematologic: negative other than that listed in the HPI Musculoskeletal: negative other than that listed in the HPI Neurological: negative other than that listed in the HPI Allergy/Immunologic: negative other than that listed in the HPI    Objective:   Blood pressure 136/80, pulse 84, resp. rate 16, height 5' 6.5" (1.689 m), weight 193 lb (87.5 kg). Body mass index is 30.68 kg/m.   Physical Exam:  General: Alert, interactive, in no acute distress. Pleasant talkative male.  Eyes: No conjunctival injection bilaterally, no discharge on the right, no discharge on the left and no Horner-Trantas dots present. PERRL bilaterally. EOMI without pain. No photophobia.  Ears: Right TM pearly gray with normal light reflex, Left TM pearly gray with normal light reflex, Right TM intact without perforation and Left TM intact without perforation.  Nose/Throat: External nose within normal limits and septum midline. Turbinates edematous and pale with clear discharge. Posterior oropharynx mildly erythematous without  cobblestoning in the posterior oropharynx. Tonsils 2+ without exudates.  Tongue without thrush. Neck: Supple without thyromegaly. Trachea midline. Adenopathy: shoddy bilateral anterior cervical lymphadenopathy Lungs: Clear to auscultation without wheezing, rhonchi or rales. No increased work of breathing. CV: Normal S1/S2. No murmurs. Capillary refill <2 seconds.  Abdomen: Nondistended, nontender. No guarding or rebound tenderness. Bowel sounds faint and present in all fields  Skin: Warm and dry, without lesions or rashes. Extremities:  No clubbing, cyanosis or edema. Neuro:   Grossly intact. No focal deficits appreciated. Responsive to questions.  Diagnostic studies: none      Salvatore Marvel, MD [facility]

## 2017-07-04 NOTE — Progress Notes (Signed)
Just fYI. He is your patient.

## 2017-07-07 LAB — IGE+ALLERGENS ZONE 2(30)
Aspergillus Fumigatus IgE: 0.13 kU/L — AB
Bahia Grass IgE: <0.1 kU/L
Cedar, Mountain IgE: <0.1 kU/L
Cockroach, American IgE: 0.12 kU/L — AB
Common Silver Birch IgE: <0.1 kU/L
D Pteronyssinus IgE: <0.1 kU/L
D002-IGE D FARINAE: 0.13 kU/L — AB
Dog Dander IgE: <0.1 kU/L
Elm, American IgE: <0.1 kU/L
Hickory, White IgE: <0.1 kU/L
IGE (IMMUNOGLOBULIN E), SERUM: 526 [IU]/mL — AB (ref 0–100)
Johnson Grass IgE: <0.1 kU/L
M004-IGE MUCOR RACEMOSUS: 0.19 kU/L — AB
Maple/Box Elder IgE: <0.1 kU/L
Mugwort IgE Qn: <0.1 kU/L
Oak, White IgE: 0.15 kU/L — AB
Penicillium Chrysogen IgE: <0.1 kU/L
Pigweed, Rough IgE: <0.1 kU/L
Plantain, English IgE: 0.13 kU/L — AB
Sheep Sorrel IgE Qn: <0.1 kU/L
W020-IGE NETTLE: 0.34 kU/L — AB
White Mulberry IgE: <0.1 kU/L

## 2017-07-07 LAB — COMPLEMENT COMPONENT C1Q: COMPLEMENT C1Q: 15.6 mg/dL (ref 11.8–23.8)

## 2017-07-07 LAB — C1 ESTERASE INHIBITOR, FUNCTIONAL: C1 ESTERASE INH FUNCT: 97 %{normal}

## 2017-07-07 LAB — CHRONIC URTICARIA: cu index: 3.1 (ref <10)

## 2017-07-07 LAB — C4 COMPLEMENT: Complement C4, Serum: 25 mg/dL (ref 14–44)

## 2017-07-07 LAB — C1 ESTERASE INHIBITOR: C1 ESTERASE INH: 29 mg/dL (ref 21–39)

## 2017-07-08 ENCOUNTER — Ambulatory Visit (INDEPENDENT_AMBULATORY_CARE_PROVIDER_SITE_OTHER): Payer: BC Managed Care – PPO | Admitting: Family Medicine

## 2017-07-08 ENCOUNTER — Encounter: Payer: Self-pay | Admitting: Family Medicine

## 2017-07-08 VITALS — BP 140/80 | HR 92 | Temp 100.0°F | Ht 66.5 in | Wt 193.2 lb

## 2017-07-08 DIAGNOSIS — H6642 Suppurative otitis media, unspecified, left ear: Secondary | ICD-10-CM

## 2017-07-08 MED ORDER — AMOXICILLIN 875 MG PO TABS: 875.0000 mg | ORAL_TABLET | Freq: Two times a day (BID) | ORAL | 0 refills | Status: DC

## 2017-07-08 NOTE — Patient Instructions (Signed)
Take all the antibiotic and call me if you not totally back to normal. Use Robitussin-DM or Delsym during the day and NyQuil at night. Use Aleve 2 pills twice per day for fever aches and pains.

## 2017-07-08 NOTE — Progress Notes (Signed)
   Subjective:    Patient ID: Rodney Wade, male    DOB: 15-May-1956, 61 y.o.   MRN: [id number]  HPI He complains of a five-day history it started with slight sore throat followed by fever, slight chills, cough that has become productive but no sneezing, nasal congestion, earache. He does have underlying urgent type symptoms and has had difficulty with recent angioedema. Presently he is taking Zyrtec.   Review of Systems     Objective:   Physical Exam Alert and in no distress. Tympanic membrane on the right is normal, left is dull and vascular, canals are normal. Pharyngeal area is normal. Neck is supple without adenopathy or thyromegaly. Cardiac exam shows a regular sinus rhythm without murmurs or gallops. Lungs are clear to auscultation.        Assessment & Plan:  Suppurative otitis media of left ear, unspecified chronicity - Plan: amoxicillin (AMOXIL) 875 MG tablet Recommended cough suppressant of choice. Call if further difficulty.

## 2018-05-17 ENCOUNTER — Other Ambulatory Visit: Payer: Self-pay | Admitting: Family Medicine

## 2018-05-17 DIAGNOSIS — I10 Essential (primary) hypertension: Secondary | ICD-10-CM

## 2018-05-31 ENCOUNTER — Ambulatory Visit: Payer: BC Managed Care – PPO | Admitting: Family Medicine

## 2018-05-31 ENCOUNTER — Encounter: Payer: Self-pay | Admitting: Family Medicine

## 2018-05-31 VITALS — BP 114/70 | HR 77 | Temp 97.5°F | Ht 67.0 in | Wt 195.2 lb

## 2018-05-31 DIAGNOSIS — Z23 Encounter for immunization: Secondary | ICD-10-CM

## 2018-05-31 DIAGNOSIS — I10 Essential (primary) hypertension: Secondary | ICD-10-CM

## 2018-05-31 DIAGNOSIS — E669 Obesity, unspecified: Secondary | ICD-10-CM

## 2018-05-31 DIAGNOSIS — Z86010 Personal history of colonic polyps: Secondary | ICD-10-CM

## 2018-05-31 DIAGNOSIS — Z0000 Encounter for general adult medical examination without abnormal findings: Secondary | ICD-10-CM | POA: Diagnosis not present

## 2018-05-31 DIAGNOSIS — M7712 Lateral epicondylitis, left elbow: Secondary | ICD-10-CM

## 2018-05-31 DIAGNOSIS — I350 Nonrheumatic aortic (valve) stenosis: Secondary | ICD-10-CM | POA: Insufficient documentation

## 2018-05-31 DIAGNOSIS — Z8669 Personal history of other diseases of the nervous system and sense organs: Secondary | ICD-10-CM | POA: Diagnosis not present

## 2018-05-31 LAB — LIPID PANEL
Chol/HDL Ratio: 3.1 ratio (ref 0.0–5.0)
Cholesterol, Total: 183 mg/dL (ref 100–199)
HDL: 59 mg/dL (ref >39)
LDL CALC: 104 mg/dL — AB (ref 0–99)
Triglycerides: 102 mg/dL (ref 0–149)
VLDL CHOLESTEROL CAL: 20 mg/dL (ref 5–40)

## 2018-05-31 LAB — COMPREHENSIVE METABOLIC PANEL
ALBUMIN: 4.4 g/dL (ref 3.6–4.8)
ALT: 23 IU/L (ref 0–44)
AST: 23 IU/L (ref 0–40)
Albumin/Globulin Ratio: 1.9 (ref 1.2–2.2)
Alkaline Phosphatase: 56 IU/L (ref 39–117)
BILIRUBIN TOTAL: 0.5 mg/dL (ref 0.0–1.2)
BUN / CREAT RATIO: 20 (ref 10–24)
BUN: 18 mg/dL (ref 8–27)
CHLORIDE: 103 mmol/L (ref 96–106)
CO2: 24 mmol/L (ref 20–29)
CREATININE: 0.92 mg/dL (ref 0.76–1.27)
Calcium: 9.4 mg/dL (ref 8.6–10.2)
GFR, EST AFRICAN AMERICAN: 101 mL/min/{1.73_m2} (ref >59)
GFR, EST NON AFRICAN AMERICAN: 88 mL/min/{1.73_m2} (ref >59)
GLUCOSE: 93 mg/dL (ref 65–99)
Globulin, Total: 2.3 g/dL (ref 1.5–4.5)
Potassium: 4.2 mmol/L (ref 3.5–5.2)
Sodium: 141 mmol/L (ref 134–144)
Total Protein: 6.7 g/dL (ref 6.0–8.5)

## 2018-05-31 LAB — CBC WITH DIFFERENTIAL/PLATELET
BASOS ABS: 0.1 10*3/uL (ref 0.0–0.2)
BASOS: 1 %
EOS (ABSOLUTE): 0.5 10*3/uL — ABNORMAL HIGH (ref 0.0–0.4)
Eos: 9 %
Hematocrit: 42.7 % (ref 37.5–51.0)
Hemoglobin: 14.6 g/dL (ref 13.0–17.7)
Immature Grans (Abs): 0 10*3/uL (ref 0.0–0.1)
Immature Granulocytes: 0 %
LYMPHS: 39 %
Lymphocytes Absolute: 2.2 10*3/uL (ref 0.7–3.1)
MCH: 31.1 pg (ref 26.6–33.0)
MCHC: 34.2 g/dL (ref 31.5–35.7)
MCV: 91 fL (ref 79–97)
MONOCYTES: 10 %
Monocytes Absolute: 0.6 10*3/uL (ref 0.1–0.9)
NEUTROS ABS: 2.4 10*3/uL (ref 1.4–7.0)
Neutrophils: 41 %
Platelets: 243 10*3/uL (ref 150–450)
RBC: 4.7 x10E6/uL (ref 4.14–5.80)
RDW: 13.9 % (ref 12.3–15.4)
WBC: 5.7 10*3/uL (ref 3.4–10.8)

## 2018-05-31 MED ORDER — AMLODIPINE BESYLATE 5 MG PO TABS: ORAL_TABLET | ORAL | 3 refills | Status: DC

## 2018-05-31 NOTE — Progress Notes (Signed)
Subjective:    Patient ID: Rodney Wade, male    DOB: April 03, 1956, 62 y.o.   MRN: [id number]  HPI He is here for complete examination.  He has had some difficulty with pain in the left elbow especially when he reaches down to pick up objects.  He also complains of difficulty with fatigue.  He does get enough rest but sometimes does take afternoon naps.  His energy and stamina as well as libido seem to be doing okay.  He does have some difficulty with arthritis with some various aches and pains.  He continues on his amlodipine and having no difficulty with that.  Does have a history of migraine headaches but has not had any recently.  He is also been evaluated for his murmur and the echo did show very mild aortic stenosis with extensive calcification and trileaflet.  He has had no chest pain, syncopal episodes, PND or DOE.  He does have a history of adenomatous polyp colonic polyps and is scheduled for repeat colonoscopy in a couple of years.  Otherwise his work and home life seem to be going well.  Family and social history as well as health maintenance and immunizations was reviewed.   Review of Systems  All other systems reviewed and are negative.      Objective:   Physical Exam BP 114/70 (BP Location: Left Arm, Patient Position: Sitting)   Pulse 77   Temp (!) 97.5 F (36.4 C)   Ht 5\' 7"  (1.702 m)   Wt 195 lb 3.2 oz (88.5 kg)   SpO2 97%   BMI 30.57 kg/m   General Appearance:    Alert, cooperative, no distress, appears stated age  Head:    Normocephalic, without obvious abnormality, atraumatic  Eyes:    PERRL, conjunctiva/corneas clear, EOM's intact, fundi    benign  Ears:    Normal TM's and external ear canals  Nose:   Nares normal, mucosa normal, no drainage or sinus   tenderness  Throat:   Lips, mucosa, and tongue normal; teeth and gums normal  Neck:   Supple, no lymphadenopathy;  thyroid:  no   enlargement/tenderness/nodules; no carotid   bruit or JVD  Back:     Spine nontender, no curvature, ROM normal, no CVA     tenderness  Lungs:     Clear to auscultation bilaterally without wheezes, rales or     ronchi; respirations unlabored      Heart:    Regular rate and rhythm, S1 and S2 normal, no murmur, rub   or gallop     Abdomen:     Soft, non-tender, nondistended, normoactive bowel sounds,    no masses, no hepatosplenomegaly  Genitalia:   Deferred     Extremities:   No clubbing, cyanosis or edema.  Tender to palpation over the left lateral epicondyle.  Provocative testing was positive.  Pulses:   2+ and symmetric all extremities  Skin:   Skin color, texture, turgor normal, no rashes or lesions  Lymph nodes:   Cervical, supraclavicular, and axillary nodes normal  Neurologic:   CNII-XII intact, normal strength, sensation and gait; reflexes 2+ and symmetric throughout          Psych:   Normal mood, affect, hygiene and grooming.          Assessment & Plan:  Annual physical exam - Plan: CBC with Differential/Platelet, Comprehensive metabolic panel, Lipid panel  Need for influenza vaccination - Plan: Flu Vaccine QUAD 6+ mos PF IM (  Fluarix Quad PF)  Hx of adenomatous colonic polyps  Obesity (BMI 30-39.9) - Plan: CBC with Differential/Platelet, Comprehensive metabolic panel, Lipid panel  Essential hypertension - Plan: amLODipine (NORVASC) 5 MG tablet  Lateral epicondylitis of left elbow  History of migraine headaches  Aortic stenosis with trileaflet valve Discussed diet and exercise with him especially cutting back on carbohydrates and 20 minutes of something physical daily.  He will continue on Norvasc.  No therapy needed for the migraine headaches.  We will readdress the stenosis at a later date.  Discussed treatment of the epicondylitis with doing as many things palms up and open.

## 2018-11-29 ENCOUNTER — Encounter: Payer: Self-pay | Admitting: Family Medicine

## 2018-11-29 ENCOUNTER — Ambulatory Visit: Payer: BC Managed Care – PPO | Admitting: Family Medicine

## 2018-11-29 ENCOUNTER — Other Ambulatory Visit: Payer: Self-pay

## 2018-11-29 VITALS — BP 152/92 | HR 86 | Temp 97.7°F | Wt 200.0 lb

## 2018-11-29 DIAGNOSIS — I10 Essential (primary) hypertension: Secondary | ICD-10-CM

## 2018-11-29 DIAGNOSIS — F419 Anxiety disorder, unspecified: Secondary | ICD-10-CM

## 2018-11-29 MED ORDER — ALPRAZOLAM 0.25 MG PO TABS
0.2500 mg | ORAL_TABLET | Freq: Two times a day (BID) | ORAL | 0 refills | Status: DC | PRN
Start: 2018-11-29 — End: 2019-08-25

## 2018-11-29 NOTE — Progress Notes (Signed)
   Subjective:    Patient ID: Rodney Wade, male    DOB: 04-09-1956, 63 y.o.   MRN: [id number]  HPI He is here for consult concerning elevated blood pressure.  He has been under a lot of stress recently trying to deal with getting himself set up to do video counseling.  He is noted some flushing of his face , pounding heart and anxiety    Review of Systems     Objective:   Physical Exam Alert and in no distress.  Blood pressure is recorded and is slightly elevated.       Assessment & Plan:  Anxiety - Plan: ALPRAZolam (XANAX) 0.25 MG tablet  Essential hypertension I discussed the fact that it would be inappropriate to change his medication around based on one blood pressure reading.  I will recheck that in 1 month.  We will also give some Xanax to help with anxiety which is probably the cause of his flushed feeling and rapid heart rate.

## 2019-01-10 ENCOUNTER — Other Ambulatory Visit: Payer: Self-pay

## 2019-01-10 ENCOUNTER — Ambulatory Visit (INDEPENDENT_AMBULATORY_CARE_PROVIDER_SITE_OTHER): Payer: BC Managed Care – PPO | Admitting: Family Medicine

## 2019-01-10 ENCOUNTER — Encounter: Payer: Self-pay | Admitting: Family Medicine

## 2019-01-10 VITALS — BP 130/78 | Temp 97.1°F | Wt 194.0 lb

## 2019-01-10 DIAGNOSIS — F419 Anxiety disorder, unspecified: Secondary | ICD-10-CM | POA: Diagnosis not present

## 2019-01-10 DIAGNOSIS — I10 Essential (primary) hypertension: Secondary | ICD-10-CM

## 2019-01-10 DIAGNOSIS — M25552 Pain in left hip: Secondary | ICD-10-CM

## 2019-01-10 NOTE — Progress Notes (Signed)
   Subjective:    Patient ID: Rodney Wade, male    DOB: [birth year], 63 y.o.   MRN: [id number]  HPI Documentation for virtual telephone encounter. Documentation for virtual audio and video telecommunications through [place] encounter: The patient was located at home. The provider was located in the office. The patient did consent to this visit and is aware of possible charges through their insurance for this visit.  The other persons participating in this telemedicine service were none. Time spent on call was 15 minutes This virtual service is not related to other E/M service within previous 7 days. He has been checking his blood pressure at home and has readings in the 130/80 range.  He has not checked the machine against ours. Psychologically he seems to be doing much better finally readjusting to the new reality of E visits.  He is only had to take 3 Xanax. He also has been having some left hip pain especially with movement.  No history of injury to that.     Review of Systems     Objective:   Physical Exam Alert and in no distress with appropriate affect via phone.       Assessment & Plan:  Essential hypertension  Anxiety  Left hip pain He will continue to monitor his blood pressure.  In the near future he is to come in with his blood pressure cuff so we can measure it against ours. He seems to be handling the anxiety fairly well so no further intervention needed. Recommend Tylenol for the hip pain and if continued difficulty com in for further evaluation including x-ray.

## 2019-01-31 ENCOUNTER — Ambulatory Visit
Admission: RE | Admit: 2019-01-31 | Discharge: 2019-01-31 | Disposition: A | Discharge status: Home / Self Care | Payer: BC Managed Care – PPO | Source: Ambulatory Visit | Attending: Family Medicine | Admitting: Family Medicine

## 2019-01-31 ENCOUNTER — Other Ambulatory Visit: Payer: Self-pay

## 2019-01-31 ENCOUNTER — Ambulatory Visit: Payer: BC Managed Care – PPO | Admitting: Family Medicine

## 2019-01-31 VITALS — BP 138/84 | HR 68

## 2019-01-31 DIAGNOSIS — M25552 Pain in left hip: Secondary | ICD-10-CM | POA: Diagnosis not present

## 2019-01-31 DIAGNOSIS — I10 Essential (primary) hypertension: Secondary | ICD-10-CM | POA: Diagnosis not present

## 2019-01-31 DIAGNOSIS — J029 Acute pharyngitis, unspecified: Secondary | ICD-10-CM

## 2019-01-31 NOTE — Progress Notes (Signed)
   Subjective:    Patient ID: Rodney Wade, male    DOB: [birth year], 63 y.o.   MRN: [id number]  HPI He is here for consult concerning continued difficulty with left hip pain.  It is intermittent in nature.  He has been using Tylenol with some relief of his symptoms.  He also complains of a slight sore throat but no fever, chills, cough or congestion.  Continues on blood pressure medication.   Review of Systems     Objective:   Physical Exam Alert and in no distress.  Throat is clear.  Neck is supple without adenopathy.  Blood pressure is recorded.  Hip motion shows some slight decrease with internal as well as external rotation.      Assessment & Plan:  Left hip pain - Plan: DG Hip Unilat W OR W/O Pelvis 2-3 Views Left  Essential hypertension  Sore throat Continue on present blood pressure medication.  Supportive care for the sore throat. Discussed treatment of the hip pain with adding an NSAID and if x-rays show significant arthritis, possible referral for injection

## 2019-02-03 ENCOUNTER — Telehealth: Payer: Self-pay | Admitting: Family Medicine

## 2019-02-03 NOTE — Telephone Encounter (Signed)
He should have heard from [place].What happened? Set this up

## 2019-02-03 NOTE — Telephone Encounter (Signed)
Pt called for X-ray results because he still had not heard anything, gave him results and he would like the referral to Dr. Charlann Boxer as soon as possible.

## 2019-02-07 ENCOUNTER — Other Ambulatory Visit: Payer: Self-pay

## 2019-02-07 DIAGNOSIS — M25552 Pain in left hip: Secondary | ICD-10-CM

## 2019-02-08 ENCOUNTER — Ambulatory Visit: Payer: BC Managed Care – PPO | Admitting: Family Medicine

## 2019-02-08 ENCOUNTER — Encounter: Payer: Self-pay | Admitting: Family Medicine

## 2019-02-08 ENCOUNTER — Other Ambulatory Visit: Payer: Self-pay

## 2019-02-08 VITALS — BP 138/86 | HR 68 | Temp 98.2°F | Wt 200.6 lb

## 2019-02-08 DIAGNOSIS — H6642 Suppurative otitis media, unspecified, left ear: Secondary | ICD-10-CM | POA: Diagnosis not present

## 2019-02-08 DIAGNOSIS — H6691 Otitis media, unspecified, right ear: Secondary | ICD-10-CM | POA: Diagnosis not present

## 2019-02-08 MED ORDER — AMOXICILLIN 875 MG PO TABS: 875.0000 mg | ORAL_TABLET | Freq: Two times a day (BID) | ORAL | 0 refills | Status: DC

## 2019-02-08 NOTE — Progress Notes (Signed)
   Subjective:    Patient ID: Rodney Wade, male    DOB: 11/30/1955, 63 y.o.   MRN: [id number]  HPI He complains of continued difficulty with ear pain especially on the right.  No fever, chills but still some sore throat.  No cough or congestion.   Review of Systems     Objective:   Physical Exam Alert and in no distress.  Exam of the left TM and canal is normal.  The right canal is normal but the TM is dull and vascular.  Neck is supple without adenopathy.  Throat is clear.       Assessment & Plan:   ROM Amoxil given

## 2019-02-16 ENCOUNTER — Ambulatory Visit: Payer: BC Managed Care – PPO | Admitting: Family Medicine

## 2019-02-21 ENCOUNTER — Ambulatory Visit (INDEPENDENT_AMBULATORY_CARE_PROVIDER_SITE_OTHER): Payer: Medicare Other | Admitting: Family Medicine

## 2019-02-21 ENCOUNTER — Other Ambulatory Visit: Payer: Self-pay

## 2019-02-21 ENCOUNTER — Encounter: Payer: Self-pay | Admitting: Family Medicine

## 2019-02-21 DIAGNOSIS — L0889 Other specified local infections of the skin and subcutaneous tissue: Secondary | ICD-10-CM | POA: Diagnosis not present

## 2019-02-21 DIAGNOSIS — M25552 Pain in left hip: Secondary | ICD-10-CM | POA: Insufficient documentation

## 2019-02-21 DIAGNOSIS — L089 Local infection of the skin and subcutaneous tissue, unspecified: Secondary | ICD-10-CM | POA: Insufficient documentation

## 2019-02-21 MED ORDER — VITAMIN D (ERGOCALCIFEROL) 1.25 MG (50000 UNIT) PO CAPS: 50000.0000 [IU] | ORAL_CAPSULE | ORAL | 0 refills | Status: DC

## 2019-02-21 MED ORDER — FLUCONAZOLE 200 MG PO TABS: 200.0000 mg | ORAL_TABLET | Freq: Every day | ORAL | 0 refills | Status: DC

## 2019-02-21 MED ORDER — GABAPENTIN 100 MG PO CAPS: 200.0000 mg | ORAL_CAPSULE | Freq: Every day | ORAL | 3 refills | Status: DC

## 2019-02-21 NOTE — Progress Notes (Signed)
[name] [address] [place], [place] [address] Phone: [phone] Subjective:   [name], am serving as a scribe for Dr. Hulan Saas.  I'm seeing this patient by the request  of:    CC: Left hip pain  [id number]  Rodney Wade is a 63 y.o. male coming in with complaint of left hip pain. Patient states that his left hip over the greater trochanter. Pain with walking in the store. No pain with lying on that side. Was able to do 5 miles on elliptical prior to quarantine. Has been walking 3 miles daily until his pain started to increase over the past few weeks. Has tried ice, stretching, IBU. Does not lose sleep over the pain. Does sit a lot for work as he works in Company [employer].       Past Medical History:  Diagnosis Date  . Gout   . HH (hiatus hernia)   . History of benign essential tremor   . Hypertension   . Migraine headache    Past Surgical History:  Procedure Laterality Date  . bone spur removal Right 2006  . GANGLION CYST EXCISION     Social History   Socioeconomic History  . Marital status: Divorced    Spouse name: Not on file  . Number of children: Not on file  . Years of education: Not on file  . Highest education level: Not on file  Occupational History  . Not on file  Social Needs  . Financial resource strain: Not on file  . Food insecurity:    Worry: Not on file    Inability: Not on file  . Transportation needs:    Medical: Not on file    Non-medical: Not on file  Tobacco Use  . Smoking status: Never Smoker  . Smokeless tobacco: Never Used  Substance and Sexual Activity  . Alcohol use: Yes    Alcohol/week: 4.0 standard drinks    Types: 4 Glasses of wine per week    Comment: occasional  . Drug use: No  . Sexual activity: Yes  Lifestyle  . Physical activity:    Days per week: Not on file    Minutes per session: Not on file  . Stress: Not on file  Relationships  . Social connections:     Talks on phone: Not on file    Gets together: Not on file    Attends religious service: Not on file    Active member of club or organization: Not on file    Attends meetings of clubs or organizations: Not on file    Relationship status: Not on file  Other Topics Concern  . Not on file  Social History Narrative  . Not on file   Allergies  Allergen Reactions  . Ace Inhibitors Swelling   Family History  Problem Relation Age of Onset  . Hypertension Mother   . Hypertension Father   . Colon cancer Neg Hx   . Allergic rhinitis Neg Hx   . Angioedema Neg Hx   . Asthma Neg Hx   . Eczema Neg Hx      Current Outpatient Medications (Cardiovascular):  .  amLODipine (NORVASC) 5 MG tablet, TAKE 1 TABLET(5 MG) BY MOUTH DAILY .  EPINEPHrine 0.3 mg/0.3 mL IJ SOAJ injection,   Current Outpatient Medications (Respiratory):  .  cetirizine (ZYRTEC) 10 MG chewable tablet, Chew 10 mg by mouth daily. Marland Kitchen  Phenylephrine-DM-GG (MUCINEX FAST-MAX CONGEST COUGH) 2.5-5-100 MG/5ML  LIQD, Take 15 mLs by mouth every 4 (four) hours as needed.  Current Outpatient Medications (Analgesics):  .  aspirin 81 MG tablet, Take 81 mg by mouth daily.   Marland Kitchen  ibuprofen (ADVIL,MOTRIN) 200 MG tablet, Take 200 mg by mouth every 6 (six) hours as needed (pain.).   Current Outpatient Medications (Other):  Marland Kitchen  ALPRAZolam (XANAX) 0.25 MG tablet, Take 1 tablet (0.25 mg total) by mouth 2 (two) times daily as needed for anxiety. Marland Kitchen  amoxicillin (AMOXIL) 875 MG tablet, Take 1 tablet (875 mg total) by mouth 2 (two) times daily. .  Multiple Vitamin (MULTIVITAMIN WITH MINERALS) TABS tablet, Take 1 tablet by mouth daily. .  phentermine 37.5 MG capsule, Take 1 capsule (37.5 mg total) by mouth every morning. .  fluconazole (DIFLUCAN) 200 MG tablet, Take 1 tablet (200 mg total) by mouth daily. Marland Kitchen  gabapentin (NEURONTIN) 100 MG capsule, Take 2 capsules (200 mg total) by mouth at bedtime. .  Vitamin D, Ergocalciferol, (DRISDOL) 1.25 MG  (50000 UT) CAPS capsule, Take 1 capsule (50,000 Units total) by mouth every 7 (seven) days.    Past medical history, social, surgical and family history all reviewed in electronic medical record.  No pertanent information unless stated regarding to the chief complaint.   Review of Systems:  No headache, visual changes, nausea, vomiting, diarrhea, constipation, dizziness, abdominal pain, skin rash, fevers, chills, night sweats, weight loss, swollen lymph nodes, body aches, joint swelling,  chest pain, shortness of breath, mood changes.  Positive muscle aches  Objective  Blood pressure 132/80, pulse 90, height 5\' 7"  (1.702 m), weight 200 lb (90.7 kg), SpO2 95 %. Systems examined below as of    General: No apparent distress alert and oriented x3 mood and affect normal, dressed appropriately.  HEENT: Pupils equal, extraocular movements intact  Respiratory: Patient's speak in full sentences and does not appear short of breath  Cardiovascular: No lower extremity edema, non tender, no erythema  Skin: Warm dry intact with no signs of infection or rash on extremities or on axial skeleton.  Abdomen: Soft nontender  Neuro: Cranial nerves II through XII are intact, neurovascularly intact in all extremities with 2+ DTRs and 2+ pulses.  Lymph: No lymphadenopathy of posterior or anterior cervical chain or axillae bilaterally.  Gait antalgic gait MSK:  Non tender with full range of motion and good stability and symmetric strength and tone of shoulders, elbows, wrist, , knee and ankles bilaterally.  Negative exam does have some limited range of motion.  Maximum is 10 degrees of internal rotation.  Patient does have tightness with external range of motion as well.  Tender over the tensor fascia lata.  Tenderness over the gluteal area as well.  Patient does have a significant rash that appears to be yeast in the groin area.  [id number]; 15 additional minutes spent for Therapeutic exercises as stated in above notes.   This included exercises focusing on stretching, strengthening, with significant focus on eccentric aspects.   Long term goals include an improvement in range of motion, strength, endurance as well as avoiding reinjury. Patient's frequency would include in 1-2 times a day, 3-5 times a week for a duration of 6-12 weeks.  Hip strengthening exercises which included:  Pelvic tilt/bracing to help with proper recruitment of the lower abs and pelvic floor muscles  Glute strengthening to properly contract glutes without over-engaging low back and hamstrings - prone hip extension and glute bridge exercises Proper stretching techniques to increase effectiveness for the hip  flexors, groin, quads, piriformic and low back when appropriate   Proper technique shown and discussed handout in great detail with ATC.  All questions were discussed and answered.     Impression and Recommendations:     This case required medical decision making of moderate complexity. The above documentation has been reviewed and is accurate and complete Lyndal Pulley, DO       Note: This dictation was prepared with Dragon dictation along with smaller phrase technology. Any transcriptional errors that result from this process are unintentional.

## 2019-02-21 NOTE — Patient Instructions (Signed)
Good to meet you  Ice 20 minutes 2 times daily. Usually after activity and before bed. Exercises 3 times a week.  Rather you bike or elliptical if possible  Once weekly vitamin D for 12 weeks Gabapentin 200mg  at night Diflucan daily for 3 days  See me again in  4 weeks

## 2019-02-21 NOTE — Assessment & Plan Note (Signed)
Skin infection.  Seems to be more yeast in the groin.  Diflucan given.

## 2019-02-21 NOTE — Assessment & Plan Note (Signed)
Left hip pain.  Discussed with patient about home exercises, icing regimen, which activities to do which wants to avoid.  Possible labral tear could be there.  No signs of any type of arthritic changes on x-rays that were independently visualized by me.  Follow-up again 4 to 8 weeks

## 2019-03-21 ENCOUNTER — Ambulatory Visit: Payer: BC Managed Care – PPO | Admitting: Family Medicine

## 2019-03-27 ENCOUNTER — Telehealth: Payer: Self-pay | Admitting: Family Medicine

## 2019-03-27 DIAGNOSIS — H6642 Suppurative otitis media, unspecified, left ear: Secondary | ICD-10-CM

## 2019-03-27 MED ORDER — AMOXICILLIN 875 MG PO TABS: 875.0000 mg | ORAL_TABLET | Freq: Two times a day (BID) | ORAL | 0 refills | Status: DC

## 2019-03-27 NOTE — Telephone Encounter (Signed)
Pt called and said his ear infection has not fully cleared up he's still having pain and wants to know if he could get another round of Amoxicillin or if he needs a different antibiotic.Pt can be reached at [phone]

## 2019-03-27 NOTE — Telephone Encounter (Signed)
Let him know that if this does not work, we will get more aggressive

## 2019-03-28 NOTE — Telephone Encounter (Signed)
Pt advised. kh

## 2019-03-30 ENCOUNTER — Encounter: Payer: Self-pay | Admitting: Family Medicine

## 2019-03-30 ENCOUNTER — Ambulatory Visit (INDEPENDENT_AMBULATORY_CARE_PROVIDER_SITE_OTHER): Payer: Medicare Other | Admitting: Family Medicine

## 2019-03-30 ENCOUNTER — Other Ambulatory Visit: Payer: Self-pay

## 2019-03-30 VITALS — BP 118/82 | HR 107 | Ht 67.0 in | Wt 201.0 lb

## 2019-03-30 DIAGNOSIS — M25552 Pain in left hip: Secondary | ICD-10-CM

## 2019-03-30 DIAGNOSIS — M24852 Other specific joint derangements of left hip, not elsewhere classified: Secondary | ICD-10-CM | POA: Diagnosis not present

## 2019-03-30 NOTE — Assessment & Plan Note (Signed)
Still difficult   To know for sure medicines at this point. To formal physical therapy and comorbidities are posterolateral.  Differential includes still possible labral pathology given patient's imaging would be warranted if continuing to have trouble.  Patient now is making improvement with conservative therapy including gabapentin.  Lumbar radiculopathy is within the differential as well but seems to be doing significant improvement.  Follow-up with me again in 5 to 6 weeks

## 2019-03-30 NOTE — Progress Notes (Signed)
[facility] [address] [place], [place] [address] Phone: [phone] Subjective:   Rodney Wade, am serving as a scribe for Dr. Hulan Saas.    CC: Left hip pain  BEM:[id number]   02/21/2019: Left hip pain.  Discussed with patient about home exercises, icing regimen, which activities to do which wants to avoid.  Possible labral tear could be there.  Wade signs of any type of arthritic changes on x-rays that were independently visualized by me.  Follow-up again 4 to 8 weeks  Update 03/30/2019: Rodney Wade is a 63 y.o. male coming in with complaint of left hip pain. Patient is walking 2.5 miles. Pain is now at 5/10 when it was 8/10 last visit. Does feel that the gabapentin and stretches are helping. Notes stiffness in the hip. Feels that pain is more in the front of his hip.  Would state he is feeling approximately 60% better.  Still some aches and pains overall concerned feeling still some tightness in the anterior aspect of the hip.     Past Medical History:  Diagnosis Date  . Gout   . HH (hiatus hernia)   . History of benign essential tremor   . Hypertension   . Migraine headache    Past Surgical History:  Procedure Laterality Date  . bone spur removal Right 2006  . GANGLION CYST EXCISION     Social History   Socioeconomic History  . Marital status: Divorced    Spouse name: Not on file  . Number of children: Not on file  . Years of education: Not on file  . Highest education level: Not on file  Occupational History  . Not on file  Social Needs  . Financial resource strain: Not on file  . Food insecurity    Worry: Not on file    Inability: Not on file  . Transportation needs    Medical: Not on file    Non-medical: Not on file  Tobacco Use  . Smoking status: Never Smoker  . Smokeless tobacco: Never Used  Substance and Sexual Activity  . Alcohol use: Yes    Alcohol/week: 4.0 standard drinks    Types: 4 Glasses of wine  per week    Comment: occasional  . Drug use: Wade  . Sexual activity: Yes  Lifestyle  . Physical activity    Days per week: Not on file    Minutes per session: Not on file  . Stress: Not on file  Relationships  . Social Herbalist on phone: Not on file    Gets together: Not on file    Attends religious service: Not on file    Active member of club or organization: Not on file    Attends meetings of clubs or organizations: Not on file    Relationship status: Not on file  Other Topics Concern  . Not on file  Social History Narrative  . Not on file   Allergies  Allergen Reactions  . Ace Inhibitors Swelling   Family History  Problem Relation Age of Onset  . Hypertension Mother   . Hypertension Father   . Colon cancer Neg Hx   . Allergic rhinitis Neg Hx   . Angioedema Neg Hx   . Asthma Neg Hx   . Eczema Neg Hx      Current Outpatient Medications (Cardiovascular):  .  amLODipine (NORVASC) 5 MG tablet, TAKE 1 TABLET(5 MG) BY MOUTH DAILY .  EPINEPHrine 0.3 mg/0.3 mL IJ SOAJ injection,   Current Outpatient Medications (Respiratory):  .  cetirizine (ZYRTEC) 10 MG chewable tablet, Chew 10 mg by mouth daily. Marland Kitchen  Phenylephrine-DM-GG (MUCINEX FAST-MAX CONGEST COUGH) 2.5-5-100 MG/5ML LIQD, Take 15 mLs by mouth every 4 (four) hours as needed.  Current Outpatient Medications (Analgesics):  .  aspirin 81 MG tablet, Take 81 mg by mouth daily.   Marland Kitchen  ibuprofen (ADVIL,MOTRIN) 200 MG tablet, Take 200 mg by mouth every 6 (six) hours as needed (pain.).   Current Outpatient Medications (Other):  Marland Kitchen  ALPRAZolam (XANAX) 0.25 MG tablet, Take 1 tablet (0.25 mg total) by mouth 2 (two) times daily as needed for anxiety. Marland Kitchen  amoxicillin (AMOXIL) 875 MG tablet, Take 1 tablet (875 mg total) by mouth 2 (two) times daily. .  fluconazole (DIFLUCAN) 200 MG tablet, Take 1 tablet (200 mg total) by mouth daily. Marland Kitchen  gabapentin (NEURONTIN) 100 MG capsule, Take 2 capsules (200 mg total) by mouth at  bedtime. .  Multiple Vitamin (MULTIVITAMIN WITH MINERALS) TABS tablet, Take 1 tablet by mouth daily. .  phentermine 37.5 MG capsule, Take 1 capsule (37.5 mg total) by mouth every morning. .  Vitamin D, Ergocalciferol, (DRISDOL) 1.25 MG (50000 UT) CAPS capsule, Take 1 capsule (50,000 Units total) by mouth every 7 (seven) days.    Past medical history, social, surgical and family history all reviewed in electronic medical record.  Wade pertanent information unless stated regarding to the chief complaint.   Review of Systems:  Wade headache, visual changes, nausea, vomiting, diarrhea, constipation, dizziness, abdominal pain, skin rash, fevers, chills, night sweats, weight loss, swollen lymph nodes, body aches, joint swelling, , chest pain, shortness of breath, mood changes.  Positive muscle aches  Objective  Blood pressure 118/82, pulse (!) 107, height 5\' 7"  (1.702 m), weight 201 lb (91.2 kg), SpO2 97 %.    General: Wade apparent distress alert and oriented x3 mood and affect normal, dressed appropriately.  HEENT: Pupils equal, extraocular movements intact  Respiratory: Patient's speak in full sentences and does not appear short of breath  Cardiovascular: Wade lower extremity edema, non tender, Wade erythema  Skin: Warm dry intact with Wade signs of infection or rash on extremities or on axial skeleton.  Abdomen: Soft nontender  Neuro: Cranial nerves II through XII are intact, neurovascularly intact in all extremities with 2+ DTRs and 2+ pulses.  Lymph: Wade lymphadenopathy of posterior or anterior cervical chain or axillae bilaterally.  Gait normal with good balance and coordination.  MSK:  tender with full range of motion and good stability and symmetric strength and tone of shoulders, elbows, wrist, knee and ankles bilaterally.   Left hip exam shows the patient does have near full range of motion but lacks the last 5 degrees of internal and external range of motion.  Negative straight leg test.  Mild  pain on flexion against resistance of the hip.  Mild pain over the tensor fascia lata but does not appear to be in the groin area.  Wade mass noted palpated.  Negative fulcrum test     Impression and Recommendations:     The above documentation has been reviewed and is accurate and complete Rodney Pulley, DO       Note: This dictation was prepared with Dragon dictation along with smaller phrase technology. Any transcriptional errors that result from this process are unintentional.

## 2019-03-30 NOTE — Patient Instructions (Signed)
[facility]  Up to you on gabapentin Ice 2x a day for 20 min Follow up in 5-6 weeks

## 2019-04-20 ENCOUNTER — Encounter: Payer: Self-pay | Admitting: Family Medicine

## 2019-04-20 ENCOUNTER — Other Ambulatory Visit: Payer: Self-pay

## 2019-04-20 ENCOUNTER — Ambulatory Visit (INDEPENDENT_AMBULATORY_CARE_PROVIDER_SITE_OTHER): Payer: Medicare Other | Admitting: Family Medicine

## 2019-04-20 VITALS — BP 122/78 | HR 85 | Temp 98.3°F | Wt 199.0 lb

## 2019-04-20 DIAGNOSIS — I350 Nonrheumatic aortic (valve) stenosis: Secondary | ICD-10-CM

## 2019-04-20 DIAGNOSIS — H6642 Suppurative otitis media, unspecified, left ear: Secondary | ICD-10-CM | POA: Diagnosis not present

## 2019-04-20 MED ORDER — AMOXICILLIN-POT CLAVULANATE 875-125 MG PO TABS: 1.0000 | ORAL_TABLET | Freq: Two times a day (BID) | ORAL | 0 refills | Status: DC

## 2019-04-20 NOTE — Progress Notes (Signed)
   Subjective:    Patient ID: Rodney Wade, male    DOB: [birth year], 63 y.o.   MRN: [id number]  HPI He is here for recheck on continued difficulty with bilateral ear discomfort as well as a slight feeling in his throat but no fever, chills, cough, congestion. He also has a previous history of aortic stenosis 2 years ago but since then has had no chest pain, shortness of breath, PND, fainting.  Review of Systems     Objective:   Physical Exam Alert and in no distress. Tympanic membranes and canals are normal. Pharyngeal area is normal. Neck is supple without adenopathy or thyromegaly. Cardiac exam shows a regular sinus rhythm with 2/6 SEM no gallops. Lungs are clear to auscultation. Review of his echo does show some significant calcification but only minimal stenosis.       Assessment & Plan:  Suppurative otitis media of left ear, unspecified chronicity - Plan: amoxicillin-clavulanate (AUGMENTIN) 875-125 MG tablet, if his symptoms do not clear up, I will refer to ENT.  Aortic valve stenosis, etiology of cardiac valve disease unspecified - Plan: ECHOCARDIOGRAM COMPLETE,

## 2019-04-27 ENCOUNTER — Other Ambulatory Visit: Payer: Self-pay

## 2019-04-27 ENCOUNTER — Ambulatory Visit (HOSPITAL_COMMUNITY): Payer: Medicare Other | Attending: Family Medicine

## 2019-04-27 DIAGNOSIS — I350 Nonrheumatic aortic (valve) stenosis: Secondary | ICD-10-CM | POA: Insufficient documentation

## 2019-05-08 ENCOUNTER — Ambulatory Visit: Payer: BC Managed Care – PPO | Admitting: Family Medicine

## 2019-06-07 ENCOUNTER — Telehealth: Payer: Self-pay

## 2019-06-07 NOTE — Telephone Encounter (Signed)
LVM for pt to call back to complete covid screening questions KH 

## 2019-06-12 ENCOUNTER — Other Ambulatory Visit: Payer: Self-pay | Admitting: Family Medicine

## 2019-06-12 DIAGNOSIS — I10 Essential (primary) hypertension: Secondary | ICD-10-CM

## 2019-06-13 ENCOUNTER — Other Ambulatory Visit: Payer: Self-pay

## 2019-06-13 ENCOUNTER — Encounter: Payer: Self-pay | Admitting: Family Medicine

## 2019-06-13 ENCOUNTER — Ambulatory Visit (INDEPENDENT_AMBULATORY_CARE_PROVIDER_SITE_OTHER): Payer: Medicare Other | Admitting: Family Medicine

## 2019-06-13 VITALS — BP 160/92 | HR 87 | Temp 97.3°F | Ht 66.75 in | Wt 201.6 lb

## 2019-06-13 DIAGNOSIS — I350 Nonrheumatic aortic (valve) stenosis: Secondary | ICD-10-CM

## 2019-06-13 DIAGNOSIS — T464X5S Adverse effect of angiotensin-converting-enzyme inhibitors, sequela: Secondary | ICD-10-CM

## 2019-06-13 DIAGNOSIS — Z0000 Encounter for general adult medical examination without abnormal findings: Secondary | ICD-10-CM | POA: Diagnosis not present

## 2019-06-13 DIAGNOSIS — Z13220 Encounter for screening for lipoid disorders: Secondary | ICD-10-CM

## 2019-06-13 DIAGNOSIS — Z86010 Personal history of colonic polyps: Secondary | ICD-10-CM

## 2019-06-13 DIAGNOSIS — I10 Essential (primary) hypertension: Secondary | ICD-10-CM | POA: Diagnosis not present

## 2019-06-13 DIAGNOSIS — Z8669 Personal history of other diseases of the nervous system and sense organs: Secondary | ICD-10-CM

## 2019-06-13 DIAGNOSIS — T783XXS Angioneurotic edema, sequela: Secondary | ICD-10-CM

## 2019-06-13 DIAGNOSIS — Z23 Encounter for immunization: Secondary | ICD-10-CM | POA: Diagnosis not present

## 2019-06-13 DIAGNOSIS — E669 Obesity, unspecified: Secondary | ICD-10-CM

## 2019-06-13 NOTE — Progress Notes (Signed)
Rodney Wade is a 63 y.o. male who presents for annual wellness visit,CPE and follow-up on chronic medical conditions.  He has no particular concerns or complaints.  He continues on amlodipine for his hypertension without difficulty.  His hip pain is much better using the Neurontin and stretching exercises.  His immunizations were reviewed and he does need a Tdap.  He does have a history of colonic polyps and is scheduled for routine follow-up on that.  He also has a history of migraine headaches but none recently.  He has had difficulty maintaining his weight mainly because of the recent COVID pandemic interfering with his normal routines.  Does have a previous history of ACE related angioedema but none recently.  He was taking Zyrtec for that.  Also has a history of aortic stenosis but has had a recent echocardiogram.  Presently he is having no chest pain, shortness of breath, syncopal episodes.  Family and social history was reviewed.  He is sexually active but is monogamous   Immunizations and Health Maintenance Immunization History  Administered Date(s) Administered  . Influenza Split 09/21/2011  . Influenza Whole 06/30/2010  . Influenza,inj,Quad PF,6+ Mos 05/27/2017, 05/31/2018  . Tdap 04/14/2009  . Zoster 04/08/2015  . Zoster Recombinat (Shingrix) 03/02/2017, 05/27/2017   Health Maintenance Due  Topic Date Due  . PNA vac Low Risk Adult (1 of 2 - PCV13) 03/07/2019  . INFLUENZA VACCINE  04/15/2019  . TETANUS/TDAP  04/15/2019    Last colonoscopy: 01/17/15 Last PSA: unkown Dentist: Q six months  Ophtho: over one year Exercise: walking   Other doctors caring for patient include:  None   Advanced Directives: None on file .  Copy asked for    Depression screen:  See questionnaire below.     Depression screen [facility] 2/9 06/13/2019 06/13/2019 05/31/2018 05/27/2017 04/08/2015  Decreased Interest 0 0 0 0 0  Down, Depressed, Hopeless 0 0 0 0 0  PHQ - 2 Score 0 0 0 0 0    Fall  Screen: See Questionaire below.   Fall Risk  06/13/2019  Falls in the past year? 0    ADL screen:  See questionnaire below.  Functional Status Survey: Is the patient deaf or have difficulty hearing?: No Does the patient have difficulty seeing, even when wearing glasses/contacts?: No Does the patient have difficulty concentrating, remembering, or making decisions?: No Does the patient have difficulty walking or climbing stairs?: No Does the patient have difficulty dressing or bathing?: No Does the patient have difficulty doing errands alone such as visiting a doctor's office or shopping?: No   Review of Systems  Constitutional: -, -unexpected weight change, -anorexia, -fatigue Allergy: -sneezing, -itching, -congestion Dermatology: denies changing moles, rash, lumps ENT: -runny nose, -ear pain, -sore throat,  Cardiology:  -chest pain, -palpitations, -orthopnea, Respiratory: -cough, -shortness of breath, -dyspnea on exertion, -wheezing,  Gastroenterology: -abdominal pain, -nausea, -vomiting, -diarrhea, -constipation, -dysphagia Hematology: -bleeding or bruising problems Musculoskeletal: -arthralgias, -myalgias, -joint swelling, -back pain, - Ophthalmology: -vision changes,  Urology: -dysuria, -difficulty urinating,  -urinary frequency, -urgency, incontinence Neurology: -, -numbness, , -memory loss, -falls, -dizziness    PHYSICAL EXAM:  BP (!) 160/92 (BP Location: Left Arm, Patient Position: Sitting)   Pulse 87   Temp (!) 97.3 F (36.3 C)   Ht 5' 6.75" (1.695 m)   Wt 201 lb 9.6 oz (91.4 kg)   SpO2 96%   BMI 31.81 kg/m   General Appearance: Alert, cooperative, no distress, appears stated age Head: Normocephalic, without obvious  abnormality, atraumatic Eyes: PERRL, conjunctiva/corneas clear, EOM's intact, Ears: Normal TM's and external ear canals Nose: Nares normal, mucosa normal, no drainage or sinus   tenderness Throat: Lips, mucosa, and tongue normal; teeth and gums  normal Neck: Supple, no lymphadenopathy, thyroid:no enlargement/tenderness/nodules; no carotid bruit or JVD Lungs: Clear to auscultation bilaterally without wheezes, rales or ronchi; respirations unlabored Heart: Regular rate and rhythm, S1 and S2 normal, no murmur, rub or gallop Abdomen: Soft, non-tender, nondistended, normoactive bowel sounds, no masses, no hepatosplenomegaly Pulses: 2+ and symmetric all extremities Skin: Skin color, texture, turgor normal, no rashes or lesions Lymph nodes: Cervical, supraclavicular, and axillary nodes normal Neurologic: CNII-XII intact, normal strength, sensation and gait; reflexes 2+ and symmetric throughout   Psych: Normal mood, affect, hygiene and grooming  ASSESSMENT/PLAN: Essential hypertension - Plan: CBC with Differential/Platelet, Comprehensive metabolic panel  History of migraine headaches  Aortic stenosis with trileaflet valve: Follow-up in 2 years  Hx of adenomatous colonic polyps: Follow-up as scheduled Obesity (BMI 30-39.9): Work on making further changes in diet and exercise.  Angiotensin converting enzyme inhibitor-aggravated angioedema, sequela  Routine general medical examination at a health care facility - Plan: CBC with Differential/Platelet, Comprehensive metabolic panel, Lipid panel  Screening for lipid disorders - Plan: Lipid panel  Need for influenza vaccination - Plan: Flu Vaccine QUAD High Dose(Fluad)  Need for vaccination against Streptococcus pneumoniae - Plan: Pneumococcal conjugate vaccine 13-valent . Immunization recommendations discussed.  Colonoscopy recommendations reviewed.  Continue on present medication.  Make further changes in diet and exercise.  Also recommend that he stop taking Zyrtec.   Medicare Attestation I have personally reviewed: The patient's medical and social history Their use of alcohol, tobacco or illicit drugs Their current medications and supplements The patient's functional ability  including ADLs,fall risks, home safety risks, cognitive, and hearing and visual impairment Diet and physical activities Evidence for depression or mood disorders  The patient's weight, height, and BMI have been recorded in the chart.  I have made referrals, counseling, and provided education to the patient based on review of the above and I have provided the patient with a written personalized care plan for preventive services.     Jill Alexanders, MD   06/13/2019

## 2019-06-13 NOTE — Patient Instructions (Signed)
  Rodney Wade , Thank you for taking time to come for your Medicare Wellness Visit. I appreciate your ongoing commitment to your health goals. Please review the following plan we discussed and let me know if I can assist you in the future.   These are the goals we discussed: Stop the Zyrtec.  Continue on present medications and work further on diet and exercise modification. This is a list of the screening recommended for you and due dates:  Health Maintenance  Topic Date Due  . Pneumonia vaccines (1 of 2 - PCV13) 03/07/2019  . Flu Shot  04/15/2019  . Tetanus Vaccine  04/15/2019  . Colon Cancer Screening  01/16/2020  .  Hepatitis C: One time screening is recommended by Center for Disease Control  (CDC) for  adults born from [birth year] through 1967.   Completed  . HIV Screening  Completed

## 2019-06-15 LAB — COMPREHENSIVE METABOLIC PANEL
ALT: 22 IU/L (ref 0–44)
AST: 29 IU/L (ref 0–40)
Albumin/Globulin Ratio: 1.6 (ref 1.2–2.2)
Albumin: 4.6 g/dL (ref 3.8–4.8)
Alkaline Phosphatase: 68 IU/L (ref 39–117)
BUN/Creatinine Ratio: 20 (ref 10–24)
BUN: 19 mg/dL (ref 8–27)
Bilirubin Total: 0.3 mg/dL (ref 0.0–1.2)
CO2: 23 mmol/L (ref 20–29)
Calcium: 9.6 mg/dL (ref 8.6–10.2)
Chloride: 104 mmol/L (ref 96–106)
Creatinine, Ser: 0.95 mg/dL (ref 0.76–1.27)
GFR calc Af Amer: 97 mL/min/{1.73_m2} (ref >59)
GFR calc non Af Amer: 84 mL/min/{1.73_m2} (ref >59)
Globulin, Total: 2.9 g/dL (ref 1.5–4.5)
Glucose: 89 mg/dL (ref 65–99)
Potassium: 4.7 mmol/L (ref 3.5–5.2)
Sodium: 144 mmol/L (ref 134–144)
Total Protein: 7.5 g/dL (ref 6.0–8.5)

## 2019-06-15 LAB — CBC WITH DIFFERENTIAL/PLATELET
Basophils Absolute: 0.1 10*3/uL (ref 0.0–0.2)
Basos: 1 %
EOS (ABSOLUTE): 0.1 10*3/uL (ref 0.0–0.4)
Eos: 2 %
Hematocrit: 46.4 % (ref 37.5–51.0)
Hemoglobin: 15.2 g/dL (ref 13.0–17.7)
Immature Grans (Abs): 0 10*3/uL (ref 0.0–0.1)
Immature Granulocytes: 0 %
Lymphocytes Absolute: 2.7 10*3/uL (ref 0.7–3.1)
Lymphs: 41 %
MCH: 31.1 pg (ref 26.6–33.0)
MCHC: 32.8 g/dL (ref 31.5–35.7)
MCV: 95 fL (ref 79–97)
Monocytes Absolute: 0.8 10*3/uL (ref 0.1–0.9)
Monocytes: 12 %
Neutrophils Absolute: 2.9 10*3/uL (ref 1.4–7.0)
Neutrophils: 44 %
Platelets: 211 10*3/uL (ref 150–450)
RBC: 4.88 x10E6/uL (ref 4.14–5.80)
RDW: 14.2 % (ref 11.6–15.4)
WBC: 6.6 10*3/uL (ref 3.4–10.8)

## 2019-06-15 LAB — LIPID PANEL
Chol/HDL Ratio: 3.1 ratio (ref 0.0–5.0)
Cholesterol, Total: 218 mg/dL — ABNORMAL HIGH (ref 100–199)
HDL: 71 mg/dL (ref >39)
LDL Chol Calc (NIH): 131 mg/dL — ABNORMAL HIGH (ref 0–99)
Triglycerides: 93 mg/dL (ref 0–149)
VLDL Cholesterol Cal: 16 mg/dL (ref 5–40)

## 2019-06-30 ENCOUNTER — Ambulatory Visit (INDEPENDENT_AMBULATORY_CARE_PROVIDER_SITE_OTHER): Payer: Medicare Other | Admitting: Family Medicine

## 2019-06-30 ENCOUNTER — Encounter: Payer: Self-pay | Admitting: Family Medicine

## 2019-06-30 ENCOUNTER — Other Ambulatory Visit: Payer: Self-pay

## 2019-06-30 VITALS — BP 168/98 | HR 95 | Temp 96.9°F | Wt 201.0 lb

## 2019-06-30 DIAGNOSIS — I10 Essential (primary) hypertension: Secondary | ICD-10-CM | POA: Diagnosis not present

## 2019-06-30 NOTE — Progress Notes (Signed)
   Subjective:    Patient ID: Rodney Wade, male    DOB: May 02, 1956, 63 y.o.   MRN: [id number]  HPI He woke up feeling as if his heart was racing and to check his blood pressure.  It is recorded in the record.  He has not had any of these problems in the past.  Does have a previous history of murmur.   Review of Systems     Objective:   Physical Exam Alert and in no distress.  Cardiac exam does show a 1/6 systolic murmur.  Regular rate and rhythm.  Blood pressure is recorded       Assessment & Plan:  Essential hypertension Discussed blood pressure reading with him.  Since it slightly elevated I think it is reasonable to recheck this in 1 month.  Also instructed him on how to check his pulse.  He will keep an eye on this especially if he feels as if he is having palpitations and let me know about how fast it is and whether it is regular or irregular.  And then discussed various options on how to handle it.

## 2019-07-04 ENCOUNTER — Encounter: Payer: Self-pay | Admitting: Family Medicine

## 2019-07-04 ENCOUNTER — Other Ambulatory Visit: Payer: Self-pay | Admitting: Family Medicine

## 2019-07-04 NOTE — Telephone Encounter (Signed)
[facility] is requesting to fill pt gabapentin. Please advise KH 

## 2019-07-31 ENCOUNTER — Other Ambulatory Visit: Payer: Self-pay

## 2019-07-31 ENCOUNTER — Ambulatory Visit (INDEPENDENT_AMBULATORY_CARE_PROVIDER_SITE_OTHER): Payer: Medicare Other | Admitting: Family Medicine

## 2019-07-31 ENCOUNTER — Encounter: Payer: Self-pay | Admitting: Family Medicine

## 2019-07-31 VITALS — BP 151/92 | HR 86 | Temp 97.5°F | Wt 200.0 lb

## 2019-07-31 DIAGNOSIS — T464X5S Adverse effect of angiotensin-converting-enzyme inhibitors, sequela: Secondary | ICD-10-CM | POA: Diagnosis not present

## 2019-07-31 DIAGNOSIS — Z20822 Contact with and (suspected) exposure to covid-19: Secondary | ICD-10-CM

## 2019-07-31 DIAGNOSIS — I10 Essential (primary) hypertension: Secondary | ICD-10-CM | POA: Diagnosis not present

## 2019-07-31 DIAGNOSIS — T783XXS Angioneurotic edema, sequela: Secondary | ICD-10-CM | POA: Diagnosis not present

## 2019-07-31 MED ORDER — LOSARTAN POTASSIUM 50 MG PO TABS: 50.0000 mg | ORAL_TABLET | Freq: Every day | ORAL | 1 refills | Status: DC

## 2019-07-31 NOTE — Progress Notes (Signed)
   Subjective:    Patient ID: Rodney Wade, male    DOB: 1955/10/03, 63 y.o.   MRN: [id number]  HPI He is here for recheck on his blood pressure.  He did bring his cuff with him.  It was measured against ours and is fairly accurate.  He presently is on amlodipine.  He does have a problem taking ACE inhibitor is causing angioedema.   Review of Systems     Objective:   Physical Exam Alert and in no distress.  Blood pressure readings at home did show systolic and diastolics above the norm.       Assessment & Plan:  Essential hypertension - Plan: losartan (COZAAR) 50 MG tablet  Angiotensin converting enzyme inhibitor-aggravated angioedema, sequela I will add Cozaar to his regimen.  He is to send me a message through my chart in about 1 month to let me know what his average blood pressure is running and I will readjust accordingly.  He was comfortable with that.

## 2019-08-02 LAB — NOVEL CORONAVIRUS, NAA: SARS-CoV-2, NAA: NOT DETECTED

## 2019-08-25 ENCOUNTER — Encounter: Payer: Self-pay | Admitting: Family Medicine

## 2019-08-25 DIAGNOSIS — I10 Essential (primary) hypertension: Secondary | ICD-10-CM

## 2019-08-25 DIAGNOSIS — F419 Anxiety disorder, unspecified: Secondary | ICD-10-CM

## 2019-08-25 MED ORDER — LOSARTAN POTASSIUM 50 MG PO TABS: 50.0000 mg | ORAL_TABLET | Freq: Every day | ORAL | 1 refills | Status: DC

## 2019-08-25 MED ORDER — ALPRAZOLAM 0.25 MG PO TABS: 0.2500 mg | ORAL_TABLET | Freq: Two times a day (BID) | ORAL | 0 refills | Status: DC | PRN

## 2019-10-02 ENCOUNTER — Other Ambulatory Visit: Payer: Self-pay

## 2019-10-02 ENCOUNTER — Encounter: Payer: Self-pay | Admitting: Family Medicine

## 2019-10-02 DIAGNOSIS — I10 Essential (primary) hypertension: Secondary | ICD-10-CM

## 2019-10-02 MED ORDER — LOSARTAN POTASSIUM-HCTZ 50-12.5 MG PO TABS: 1.0000 | ORAL_TABLET | Freq: Every day | ORAL | 1 refills | Status: DC

## 2019-10-02 MED ORDER — AMLODIPINE BESYLATE 5 MG PO TABS: ORAL_TABLET | ORAL | 3 refills | Status: DC

## 2019-10-04 ENCOUNTER — Ambulatory Visit: Payer: Medicare Other | Attending: Internal Medicine

## 2019-10-04 DIAGNOSIS — Z23 Encounter for immunization: Secondary | ICD-10-CM

## 2019-10-04 NOTE — Progress Notes (Signed)
   Covid-19 Vaccination Clinic  Name:  Rodney Wade    MRN: [id number] DOB: 16-May-1956  10/04/2019  Rodney Wade was observed post Covid-19 immunization for 15 minutes without incidence. He was provided with Vaccine Information Sheet and instruction to access the V-Safe system.   Rodney Wade was instructed to call 911 with any severe reactions post vaccine: Marland Kitchen Difficulty breathing  . Swelling of your face and throat  . A fast heartbeat  . A bad rash all over your body  . Dizziness and weakness    Immunizations Administered    Name Date Dose VIS Date Route   Pfizer COVID-19 Vaccine 10/04/2019 10:28 AM 0.3 mL 08/25/2019 Intramuscular   Manufacturer: [place]   Lot: [id number]   [place]: [id number]

## 2019-10-20 ENCOUNTER — Other Ambulatory Visit: Payer: Self-pay | Admitting: Family Medicine

## 2019-10-20 DIAGNOSIS — I10 Essential (primary) hypertension: Secondary | ICD-10-CM

## 2019-10-22 ENCOUNTER — Encounter: Payer: Self-pay | Admitting: Family Medicine

## 2019-10-23 ENCOUNTER — Other Ambulatory Visit: Payer: Self-pay

## 2019-10-23 ENCOUNTER — Ambulatory Visit: Payer: Medicare Other | Attending: Internal Medicine

## 2019-10-23 DIAGNOSIS — Z23 Encounter for immunization: Secondary | ICD-10-CM

## 2019-10-23 MED ORDER — LOSARTAN POTASSIUM-HCTZ 50-12.5 MG PO TABS: 1.0000 | ORAL_TABLET | Freq: Every day | ORAL | 1 refills | Status: DC

## 2019-10-23 NOTE — Progress Notes (Signed)
   Covid-19 Vaccination Clinic  Name:  Rodney Wade    MRN: [id number] DOB: [birth year]  10/23/2019  Rodney Wade was observed post Covid-19 immunization for 30 minutes based on pre-vaccination screening without incidence. He was provided with Vaccine Information Sheet and instruction to access the V-Safe system.   Rodney Wade was instructed to call 911 with any severe reactions post vaccine: Marland Kitchen Difficulty breathing  . Swelling of your face and throat  . A fast heartbeat  . A bad rash all over your body  . Dizziness and weakness    Immunizations Administered    Name Date Dose VIS Date Route   Pfizer COVID-19 Vaccine 10/23/2019  3:52 PM 0.3 mL 08/25/2019 Intramuscular   Manufacturer: [employer]   Lot: [id number]   [place]: [id number]

## 2019-11-06 ENCOUNTER — Other Ambulatory Visit: Payer: Self-pay | Admitting: Family Medicine

## 2019-11-06 ENCOUNTER — Encounter: Payer: Self-pay | Admitting: Family Medicine

## 2019-11-06 ENCOUNTER — Ambulatory Visit (INDEPENDENT_AMBULATORY_CARE_PROVIDER_SITE_OTHER): Payer: Medicare Other | Admitting: Family Medicine

## 2019-11-06 ENCOUNTER — Other Ambulatory Visit: Payer: Self-pay

## 2019-11-06 VITALS — BP 150/82 | HR 88 | Temp 97.5°F | Wt 200.4 lb

## 2019-11-06 DIAGNOSIS — I10 Essential (primary) hypertension: Secondary | ICD-10-CM | POA: Diagnosis not present

## 2019-11-06 DIAGNOSIS — F419 Anxiety disorder, unspecified: Secondary | ICD-10-CM | POA: Diagnosis not present

## 2019-11-06 MED ORDER — LOSARTAN POTASSIUM-HCTZ 100-12.5 MG PO TABS: 1.0000 | ORAL_TABLET | Freq: Every day | ORAL | 3 refills | Status: DC

## 2019-11-06 NOTE — Progress Notes (Signed)
   Subjective:    Patient ID: Rodney Wade, male    DOB: 1956/05/16, 64 y.o.   MRN: [id number]  HPI He is here for recheck on his blood pressure.  He did bring in readings in from home.  His machine at home is accurate.  In general does show elevations. He also is having some difficulty with situational anxiety.  He seems to be handling this well but it does feel as if he is having something hanging over him.  He takes Xanax probably twice per week.   Review of Systems     Objective:   Physical Exam Alert and in no distress.  Blood pressure is recorded.      Assessment & Plan:  Essential hypertension - Plan: losartan-hydrochlorothiazide (HYZAAR) 100-12.5 MG tablet  Anxiety He is to call me in 1 month to let me know how is blood pressure is doing.  He seems to be using the Xanax appropriately and I will therefore continue him on it. We also discussed the use of Neurontin and since he is no longer having hip pain, recommend that he stop that medication.

## 2019-11-06 NOTE — Telephone Encounter (Signed)
[facility] is requesting to fill pt gabapentin. Please advise KH 

## 2019-11-22 ENCOUNTER — Encounter: Payer: Self-pay | Admitting: Family Medicine

## 2019-12-14 ENCOUNTER — Encounter: Payer: Self-pay | Admitting: Family Medicine

## 2019-12-14 MED ORDER — AMLODIPINE BESYLATE 10 MG PO TABS: 10.0000 mg | ORAL_TABLET | Freq: Every day | ORAL | 3 refills | Status: DC

## 2020-01-17 ENCOUNTER — Encounter: Payer: Self-pay | Admitting: Family Medicine

## 2020-02-01 ENCOUNTER — Encounter: Payer: Self-pay | Admitting: Internal Medicine

## 2020-03-07 ENCOUNTER — Other Ambulatory Visit: Payer: Self-pay | Admitting: *Deleted

## 2020-03-28 ENCOUNTER — Ambulatory Visit (AMBULATORY_SURGERY_CENTER): Payer: Self-pay | Admitting: *Deleted

## 2020-03-28 ENCOUNTER — Other Ambulatory Visit: Payer: Self-pay

## 2020-03-28 VITALS — Ht 67.0 in | Wt 199.0 lb

## 2020-03-28 DIAGNOSIS — Z86010 Personal history of colonic polyps: Secondary | ICD-10-CM

## 2020-03-28 MED ORDER — SUTAB 1479-225-188 MG PO TABS: 1.0000 | ORAL_TABLET | Freq: Once | ORAL | 0 refills | Status: AC

## 2020-03-28 NOTE — Progress Notes (Signed)

## 2020-04-08 ENCOUNTER — Other Ambulatory Visit: Payer: Self-pay

## 2020-04-08 ENCOUNTER — Encounter: Payer: Self-pay | Admitting: Internal Medicine

## 2020-04-08 ENCOUNTER — Ambulatory Visit (AMBULATORY_SURGERY_CENTER): Payer: Medicare Other | Admitting: Internal Medicine

## 2020-04-08 VITALS — BP 97/62 | HR 65 | Temp 96.8°F | Resp 13 | Ht 67.0 in | Wt 199.0 lb

## 2020-04-08 DIAGNOSIS — Z86010 Personal history of colonic polyps: Secondary | ICD-10-CM | POA: Diagnosis not present

## 2020-04-08 DIAGNOSIS — D125 Benign neoplasm of sigmoid colon: Secondary | ICD-10-CM | POA: Diagnosis not present

## 2020-04-08 DIAGNOSIS — D122 Benign neoplasm of ascending colon: Secondary | ICD-10-CM | POA: Diagnosis not present

## 2020-04-08 DIAGNOSIS — D123 Benign neoplasm of transverse colon: Secondary | ICD-10-CM

## 2020-04-08 MED ORDER — SODIUM CHLORIDE 0.9 % IV SOLN: 500.0000 mL | Freq: Once | INTRAVENOUS | Status: DC

## 2020-04-08 NOTE — Progress Notes (Signed)
Pt's states no medical or surgical changes since previsit or office visit.  V/S-SP  Check-in-JB

## 2020-04-08 NOTE — Progress Notes (Signed)
PT taken to PACU. Monitors in place. VSS. Report given to RN. 

## 2020-04-08 NOTE — Op Note (Signed)
[facility] Patient Name: Kay Ricciuti Procedure Date: 04/08/2020 9:08 AM MRN: [id number] Endoscopist: Jerene Bears , MD Age: 64 Referring MD:  Date of Birth: [birth year] Gender: Male Account #: [id number] Procedure:                Colonoscopy Indications:              High risk colon cancer surveillance: Personal                            history of non-advanced adenoma, Last colonoscopy:                            May 2016 Medicines:                Monitored Anesthesia Care Procedure:                Pre-Anesthesia Assessment:                           - Prior to the procedure, a History and Physical                            was performed, and patient medications and                            allergies were reviewed. The patient's tolerance of                            previous anesthesia was also reviewed. The risks                            and benefits of the procedure and the sedation                            options and risks were discussed with the patient.                            All questions were answered, and informed consent                            was obtained. Prior Anticoagulants: The patient has                            taken no previous anticoagulant or antiplatelet                            agents. ASA Grade Assessment: III - A patient with                            severe systemic disease. After reviewing the risks                            and benefits, the patient was deemed in  satisfactory condition to undergo the procedure.                           After obtaining informed consent, the colonoscope                            was passed under direct vision. Throughout the                            procedure, the patient's blood pressure, pulse, and                            oxygen saturations were monitored continuously. The                            Colonoscope was introduced through the anus and                             advanced to the cecum, identified by appendiceal                            orifice and ileocecal valve. The colonoscopy was                            performed without difficulty. The patient tolerated                            the procedure well. The quality of the bowel                            preparation was good. The ileocecal valve,                            appendiceal orifice, and rectum were photographed. Scope In: 9:19:32 AM Scope Out: 9:37:25 AM Scope Withdrawal Time: 0 hours 16 minutes 44 seconds  Total Procedure Duration: 0 hours 17 minutes 53 seconds  Findings:                 The digital rectal exam was normal.                           A 2 mm polyp was found in the ascending colon. The                            polyp was sessile. The polyp was removed with a                            cold snare. Resection and retrieval were complete.                           A 3 mm polyp was found in the transverse colon. The                            polyp was  sessile. The polyp was removed with a                            cold snare. Resection and retrieval were complete.                           Two sessile polyps were found in the distal sigmoid                            colon. The polyps were 5 to 7 mm in size. These                            polyps were removed with a cold snare. Resection                            and retrieval were complete.                           Multiple small-mouthed diverticula were found in                            the sigmoid colon and descending colon.                           External hemorrhoids were found during                            retroflexion. The hemorrhoids were small. Complications:            No immediate complications. Estimated Blood Loss:     Estimated blood loss was minimal. Impression:               - One 2 mm polyp in the ascending colon, removed                            with a cold snare.  Resected and retrieved.                           - One 3 mm polyp in the transverse colon, removed                            with a cold snare. Resected and retrieved.                           - Two 5 to 7 mm polyps in the distal sigmoid colon,                            removed with a cold snare. Resected and retrieved.                           - Diverticulosis in the sigmoid colon and in the  descending colon.                           - Small external hemorrhoids. Recommendation:           - Patient has a contact number available for                            emergencies. The signs and symptoms of potential                            delayed complications were discussed with the                            patient. Return to normal activities tomorrow.                            Written discharge instructions were provided to the                            patient.                           - Resume previous diet.                           - Continue present medications.                           - Await pathology results.                           - Repeat colonoscopy is recommended. The                            colonoscopy date will be determined after pathology                            results from today's exam become available for                            review. Jerene Bears, MD 04/08/2020 9:41:05 AM This report has been signed electronically.

## 2020-04-08 NOTE — Progress Notes (Signed)
Called to room to assist during endoscopic procedure.  Patient ID and intended procedure confirmed with present staff. Received instructions for my participation in the procedure from the performing physician.  

## 2020-04-08 NOTE — Patient Instructions (Signed)
YOU HAD AN ENDOSCOPIC PROCEDURE TODAY AT THE [facility]:   Refer to the procedure report that was given to you for any specific questions about what was found during the examination.  If the procedure report does not answer your questions, please call your gastroenterologist to clarify.  If you requested that your care partner not be given the details of your procedure findings, then the procedure report has been included in a sealed envelope for you to review at your convenience later.  YOU SHOULD EXPECT: Some feelings of bloating in the abdomen. Passage of more gas than usual.  Walking can help get rid of the air that was put into your GI tract during the procedure and reduce the bloating. If you had a lower endoscopy (such as a colonoscopy or flexible sigmoidoscopy) you may notice spotting of blood in your stool or on the toilet paper. If you underwent a bowel prep for your procedure, you may not have a normal bowel movement for a few days.  Please Note:  You might notice some irritation and congestion in your nose or some drainage.  This is from the oxygen used during your procedure.  There is no need for concern and it should clear up in a day or so.  SYMPTOMS TO REPORT IMMEDIATELY:   Following lower endoscopy (colonoscopy or flexible sigmoidoscopy):  Excessive amounts of blood in the stool  Significant tenderness or worsening of abdominal pains  Swelling of the abdomen that is new, acute  Fever of 100F or higher   For urgent or emergent issues, a gastroenterologist can be reached at any hour by calling [phone]. Do not use MyChart messaging for urgent concerns.    DIET:  We do recommend a small meal at first, but then you may proceed to your regular diet.  Drink plenty of fluids but you should avoid alcoholic beverages for 24 hours.  MEDICATIONS: Continue present medications.  Please see handouts given to you by your recovery nurse.  ACTIVITY:  You should plan to  take it easy for the rest of today and you should NOT DRIVE or use heavy machinery until tomorrow (because of the sedation medicines used during the test).    FOLLOW UP: Our staff will call the number listed on your records 48-72 hours following your procedure to check on you and address any questions or concerns that you may have regarding the information given to you following your procedure. If we do not reach you, we will leave a message.  We will attempt to reach you two times.  During this call, we will ask if you have developed any symptoms of COVID 19. If you develop any symptoms (ie: fever, flu-like symptoms, shortness of breath, cough etc.) before then, please call [phone].  If you test positive for Covid 19 in the 2 weeks post procedure, please call and report this information to us.    If any biopsies were taken you will be contacted by phone or by letter within the next 1-3 weeks.  Please call us at [phone] if you have not heard about the biopsies in 3 weeks.   Thank you for allowing us to provide for your healthcare needs today.   SIGNATURES/CONFIDENTIALITY: You and/or your care partner have signed paperwork which will be entered into your electronic medical record.  These signatures attest to the fact that that the information above on your After Visit Summary has been reviewed and is understood.  Full responsibility of the   confidentiality of this discharge information lies with you and/or your care-partner. 

## 2020-04-10 ENCOUNTER — Telehealth: Payer: Self-pay

## 2020-04-10 NOTE — Telephone Encounter (Signed)
  Follow up Call-  Call back number 04/08/2020  Post procedure Call Back phone  # [phone]  Permission to leave phone message Yes  Some recent data might be hidden     Patient questions:  Do you have a fever, pain , or abdominal swelling? No. Pain Score  0 *  Have you tolerated food without any problems? Yes.    Have you been able to return to your normal activities? Yes.    Do you have any questions about your discharge instructions: Diet   No. Medications  No. Follow up visit  No.  Do you have questions or concerns about your Care? No.  Actions: * If pain score is 4 or above: No action needed, pain <4.   1. Have you developed a fever since your procedure? No   2.   Have you had an respiratory symptoms (SOB or cough) since your procedure? No   3.   Have you tested positive for COVID 19 since your procedure? No   4.   Have you had any family members/close contacts diagnosed with the COVID 19 since your procedure?  No    If yes to any of these questions please route to Joylene John, RN and Erenest Rasher, RN

## 2020-04-11 ENCOUNTER — Encounter: Payer: Self-pay | Admitting: Internal Medicine

## 2020-05-13 ENCOUNTER — Encounter: Payer: Self-pay | Admitting: Family Medicine

## 2020-06-18 ENCOUNTER — Ambulatory Visit: Payer: Medicare Other | Attending: Internal Medicine

## 2020-06-18 DIAGNOSIS — Z23 Encounter for immunization: Secondary | ICD-10-CM

## 2020-06-18 NOTE — Progress Notes (Signed)
   Covid-19 Vaccination Clinic  Name:  Rodney Wade    MRN: [id number] DOB: [birth year]  06/18/2020  Mr. [name] was observed post Covid-19 immunization for 15 minutes without incident. He was provided with Vaccine Information Sheet and instruction to access the V-Safe system.   Mr. [name] was instructed to call 911 with any severe reactions post vaccine: Marland Kitchen Difficulty breathing  . Swelling of face and throat  . A fast heartbeat  . A bad rash all over body  . Dizziness and weakness

## 2020-07-01 ENCOUNTER — Telehealth: Payer: Self-pay

## 2020-07-01 DIAGNOSIS — Z0000 Encounter for general adult medical examination without abnormal findings: Secondary | ICD-10-CM

## 2020-07-01 DIAGNOSIS — I10 Essential (primary) hypertension: Secondary | ICD-10-CM

## 2020-07-01 DIAGNOSIS — E669 Obesity, unspecified: Secondary | ICD-10-CM

## 2020-07-01 NOTE — Telephone Encounter (Signed)
Pt. Called stating he has a CPE next Monday in the afternoon, but he wanted to come in the morning for his blood work if you could put the order in for that.

## 2020-07-08 ENCOUNTER — Other Ambulatory Visit: Payer: Self-pay

## 2020-07-08 ENCOUNTER — Encounter: Payer: Self-pay | Admitting: Family Medicine

## 2020-07-08 ENCOUNTER — Ambulatory Visit (INDEPENDENT_AMBULATORY_CARE_PROVIDER_SITE_OTHER): Payer: Medicare Other | Admitting: Family Medicine

## 2020-07-08 VITALS — BP 120/76 | HR 86 | Temp 98.1°F | Ht 66.0 in | Wt 197.0 lb

## 2020-07-08 DIAGNOSIS — Z8669 Personal history of other diseases of the nervous system and sense organs: Secondary | ICD-10-CM | POA: Diagnosis not present

## 2020-07-08 DIAGNOSIS — Z86010 Personal history of colonic polyps: Secondary | ICD-10-CM

## 2020-07-08 DIAGNOSIS — Z23 Encounter for immunization: Secondary | ICD-10-CM

## 2020-07-08 DIAGNOSIS — F419 Anxiety disorder, unspecified: Secondary | ICD-10-CM | POA: Diagnosis not present

## 2020-07-08 DIAGNOSIS — Z0000 Encounter for general adult medical examination without abnormal findings: Secondary | ICD-10-CM | POA: Diagnosis not present

## 2020-07-08 DIAGNOSIS — I10 Essential (primary) hypertension: Secondary | ICD-10-CM | POA: Diagnosis not present

## 2020-07-08 DIAGNOSIS — E559 Vitamin D deficiency, unspecified: Secondary | ICD-10-CM | POA: Diagnosis not present

## 2020-07-08 DIAGNOSIS — E669 Obesity, unspecified: Secondary | ICD-10-CM

## 2020-07-08 DIAGNOSIS — M25552 Pain in left hip: Secondary | ICD-10-CM

## 2020-07-08 DIAGNOSIS — B351 Tinea unguium: Secondary | ICD-10-CM

## 2020-07-08 DIAGNOSIS — I350 Nonrheumatic aortic (valve) stenosis: Secondary | ICD-10-CM

## 2020-07-08 LAB — LIPID PANEL

## 2020-07-08 MED ORDER — LOSARTAN POTASSIUM-HCTZ 100-12.5 MG PO TABS: 1.0000 | ORAL_TABLET | Freq: Every day | ORAL | 3 refills | Status: DC

## 2020-07-08 MED ORDER — TERBINAFINE HCL 250 MG PO TABS: 250.0000 mg | ORAL_TABLET | Freq: Every day | ORAL | 0 refills | Status: DC

## 2020-07-08 MED ORDER — AMLODIPINE BESYLATE 10 MG PO TABS: 10.0000 mg | ORAL_TABLET | Freq: Every day | ORAL | 3 refills | Status: DC

## 2020-07-08 NOTE — Progress Notes (Signed)
Rodney Wade is a 64 y.o. male who presents for annual wellness visit,CPE and follow-up on chronic medical conditions.  He is again having difficu lty with toenail infection and would like to be placed back on Lamisil. he has hypertension and is taking amlodipine as well as losartan.  He does have a history of migraine headaches but has not had any recently.  He has had a colonoscopy which did show polyps and is scheduled for follow-up in 3 years.  His hip pain is now gone.  He does have a history of aortic stenosis but is not having no chest pain, shortness of breath or syncopal episodes.  He has been dealing with some depression-like symptoms revolving around his work as well as dealing with the pandemic.  He will keep this in mind but at this point is not interested in medication. Family and social history as well as health maintenance and immunizations was reviewed  Immunizations and Health Maintenance Immunization History  Administered Date(s) Administered  . Fluad Quad(high Dose 65+) 06/13/2019  . Influenza Split 09/21/2011  . Influenza Whole 06/30/2010  . Influenza,inj,Quad PF,6+ Mos 05/27/2017, 05/31/2018  . PFIZER SARS-COV-2 Vaccination 10/04/2019, 10/23/2019, 06/18/2020  . Pneumococcal Conjugate-13 06/13/2019  . Tdap 04/14/2009, 07/04/2019  . Zoster 04/08/2015  . Zoster Recombinat (Shingrix) 03/02/2017, 05/27/2017   Health Maintenance Due  Topic Date Due  . INFLUENZA VACCINE  04/14/2020  . PNA vac Low Risk Adult (2 of 2 - PPSV23) 06/12/2020    Last colonoscopy: 04/08/20 Last PSA: Unknown Dentist:  Q six months  Ophtho: Q three years Exercise: walking 45 min a day for seven days   Other doctors caring for patient include: Dr. Hilarie Fredrickson [facility]  Advanced Directives: Does Patient Have a Medical Advance Directive?: Yes Type of Advance Directive: [name] Does patient want to make changes to medical advance directive?: No - Patient  declined Copy of [place] in Chart?: Yes - validated most recent copy scanned in chart (See row information)  Depression screen:  See questionnaire below.     Depression screen [facility] 2/9 07/08/2020 06/13/2019 06/13/2019 05/31/2018 05/27/2017  Decreased Interest 0 0 0 0 0  Down, Depressed, Hopeless 1 0 0 0 0  PHQ - 2 Score 1 0 0 0 0    Fall Screen: See Questionaire below.   Fall Risk  07/08/2020 06/13/2019  Falls in the past year? 1 0  Number falls in past yr: 0 -  Injury with Fall? 0 -  Risk for fall due to : No Fall Risks -    ADL screen:  See questionnaire below.  Functional Status Survey: Is the patient deaf or have difficulty hearing?: No Does the patient have difficulty seeing, even when wearing glasses/contacts?: No Does the patient have difficulty concentrating, remembering, or making decisions?: No Does the patient have difficulty walking or climbing stairs?: No Does the patient have difficulty dressing or bathing?: No Does the patient have difficulty doing errands alone such as visiting a doctor's office or shopping?: No   Review of Systems  Constitutional: -, -unexpected weight change, -anorexia, -fatigue Allergy: -sneezing, -itching, -congestion Dermatology: denies changing moles, rash, lumps ENT: -runny nose, -ear pain, -sore throat,  Cardiology:  -chest pain, -palpitations, -orthopnea, Respiratory: -cough, -shortness of breath, -dyspnea on exertion, -wheezing,  Gastroenterology: -abdominal pain, -nausea, -vomiting, -diarrhea, -constipation, -dysphagia Hematology: -bleeding or bruising problems Musculoskeletal: -arthralgias, -myalgias, -joint swelling, -back pain, - Ophthalmology: -vision changes,  Urology: -dysuria, -difficulty urinating,  -  urinary frequency, -urgency, incontinence Neurology: -, -numbness, , -memory loss, -falls, -dizziness  PHYSICAL EXAM: General Appearance: Alert, cooperative, no distress, appears stated age Head:  Normocephalic, without obvious abnormality, atraumatic Eyes: PERRL, conjunctiva/corneas clear, EOM's intact. Ears: Normal TM's and external ear canals Nose: Nares normal, mucosa normal, no drainage or sinus   tenderness Throat: Lips, mucosa, and tongue normal; teeth and gums normal Neck: Supple, no lymphadenopathy, thyroid:no enlargement/tenderness/nodules; no carotid bruit or JVD Lungs: Clear to auscultation bilaterally without wheezes, rales or ronchi; respirations unlabored Heart: Regular rate and rhythm, S1 and S2 normal, no murmur, rub or gallop Abdomen: Soft, non-tender, nondistended, normoactive bowel sounds, no masses, no hepatosplenomegaly Skin: Skin color, texture, turgor normal, no rashes or lesions Lymph nodes: Cervical, supraclavicular, and axillary nodes normal Neurologic: CNII-XII intact, normal strength, sensation and gait; reflexes 2+ and symmetric throughout   Psych: Normal mood, affect, hygiene and grooming  ASSESSMENT/PLAN: Routine general medical examination at a health care facility - Plan: CBC with Differential/Platelet, Comprehensive metabolic panel, Lipid panel  Essential hypertension - Plan: CBC with Differential/Platelet, Comprehensive metabolic panel, losartan-hydrochlorothiazide (HYZAAR) 100-12.5 MG tablet, amLODipine (NORVASC) 10 MG tablet  Obesity (BMI 30-39.9) - Plan: CBC with Differential/Platelet, Comprehensive metabolic panel, Lipid panel  Anxiety  History of migraine headaches  Aortic stenosis with trileaflet valve  Hx of adenomatous colonic polyps  Left hip pain  Need for influenza vaccination - Plan: Flu Vaccine QUAD High Dose(Fluad)  Need for vaccination against Streptococcus pneumoniae - Plan: Pneumococcal polysaccharide vaccine 23-valent greater than or equal to 2yo subcutaneous/IM  Vitamin D insufficiency - Plan: VITAMIN D 25 Hydroxy (Vit-D Deficiency, Fractures)  Onychomycosis - Plan: terbinafine (LAMISIL) 250 MG tablet Return here in 3  months for recheck on the onychomycosis.  Continue on present medications. Discussed at least 30 minutes of aerobic activity at least 5 days/week;  healthy diet and alcohol recommendations (less than or equal to 2 drinks/day) reviewed; [place] Kitchen Immunization recommendations discussed.  Colonoscopy recommendations reviewed. He will keep me informed about his psychological status.  Recommend Advil or Aleve if he has breakthrough in his migraines.  No other further intervention needed on the hip pain.  Return here in 3 months for reevaluation of his onychomycosis.  Medicare Attestation I have personally reviewed: The patient's medical and social history Their use of alcohol, tobacco or illicit drugs Their current medications and supplements The patient's functional ability including ADLs,fall risks, home safety risks, cognitive, and hearing and visual impairment Diet and physical activities Evidence for depression or mood disorders  The patient's weight, height, and BMI have been recorded in the chart.  I have made referrals, counseling, and provided education to the patient based on review of the above and I have provided the patient with a written personalized care plan for preventive services.     Jill Alexanders, MD   07/08/2020

## 2020-07-09 LAB — CBC WITH DIFFERENTIAL/PLATELET
Basophils Absolute: 0 10*3/uL (ref 0.0–0.2)
Basos: 1 %
EOS (ABSOLUTE): 0.3 10*3/uL (ref 0.0–0.4)
Eos: 5 %
Hematocrit: 43.6 % (ref 37.5–51.0)
Hemoglobin: 15.1 g/dL (ref 13.0–17.7)
Immature Grans (Abs): 0 10*3/uL (ref 0.0–0.1)
Immature Granulocytes: 0 %
Lymphocytes Absolute: 2.2 10*3/uL (ref 0.7–3.1)
Lymphs: 35 %
MCH: 32.5 pg (ref 26.6–33.0)
MCHC: 34.6 g/dL (ref 31.5–35.7)
MCV: 94 fL (ref 79–97)
Monocytes Absolute: 0.7 10*3/uL (ref 0.1–0.9)
Monocytes: 11 %
Neutrophils Absolute: 2.9 10*3/uL (ref 1.4–7.0)
Neutrophils: 48 %
Platelets: 245 10*3/uL (ref 150–450)
RBC: 4.64 x10E6/uL (ref 4.14–5.80)
RDW: 13.7 % (ref 11.6–15.4)
WBC: 6.1 10*3/uL (ref 3.4–10.8)

## 2020-07-09 LAB — COMPREHENSIVE METABOLIC PANEL
ALT: 27 IU/L (ref 0–44)
AST: 25 IU/L (ref 0–40)
Albumin/Globulin Ratio: 1.7 (ref 1.2–2.2)
Albumin: 4.5 g/dL (ref 3.8–4.8)
Alkaline Phosphatase: 67 IU/L (ref 44–121)
BUN/Creatinine Ratio: 15 (ref 10–24)
BUN: 15 mg/dL (ref 8–27)
Bilirubin Total: 0.3 mg/dL (ref 0.0–1.2)
CO2: 27 mmol/L (ref 20–29)
Calcium: 9.3 mg/dL (ref 8.6–10.2)
Chloride: 104 mmol/L (ref 96–106)
Creatinine, Ser: 0.98 mg/dL (ref 0.76–1.27)
GFR calc Af Amer: 92 mL/min/{1.73_m2} (ref >59)
GFR calc non Af Amer: 80 mL/min/{1.73_m2} (ref >59)
Globulin, Total: 2.7 g/dL (ref 1.5–4.5)
Glucose: 105 mg/dL — ABNORMAL HIGH (ref 65–99)
Potassium: 4.7 mmol/L (ref 3.5–5.2)
Sodium: 143 mmol/L (ref 134–144)
Total Protein: 7.2 g/dL (ref 6.0–8.5)

## 2020-07-09 LAB — LIPID PANEL
Chol/HDL Ratio: 2.5 ratio (ref 0.0–5.0)
Cholesterol, Total: 185 mg/dL (ref 100–199)
HDL: 74 mg/dL (ref >39)
LDL Chol Calc (NIH): 100 mg/dL — ABNORMAL HIGH (ref 0–99)
Triglycerides: 57 mg/dL (ref 0–149)
VLDL Cholesterol Cal: 11 mg/dL (ref 5–40)

## 2020-07-09 LAB — VITAMIN D 25 HYDROXY (VIT D DEFICIENCY, FRACTURES): Vit D, 25-Hydroxy: 53.1 ng/mL (ref 30.0–100.0)

## 2020-10-01 ENCOUNTER — Encounter: Payer: Self-pay | Admitting: Family Medicine

## 2020-10-01 DIAGNOSIS — B351 Tinea unguium: Secondary | ICD-10-CM

## 2020-10-02 MED ORDER — TERBINAFINE HCL 250 MG PO TABS: 250.0000 mg | ORAL_TABLET | Freq: Every day | ORAL | 0 refills | Status: DC

## 2020-10-09 ENCOUNTER — Ambulatory Visit: Payer: Medicare Other | Admitting: Family Medicine

## 2020-10-21 ENCOUNTER — Encounter: Payer: Self-pay | Admitting: Family Medicine

## 2020-10-21 ENCOUNTER — Other Ambulatory Visit: Payer: Self-pay

## 2020-10-21 ENCOUNTER — Ambulatory Visit (INDEPENDENT_AMBULATORY_CARE_PROVIDER_SITE_OTHER): Payer: BC Managed Care – PPO | Admitting: Family Medicine

## 2020-10-21 VITALS — BP 128/88 | HR 90 | Temp 97.7°F | Wt 193.8 lb

## 2020-10-21 DIAGNOSIS — Z79899 Other long term (current) drug therapy: Secondary | ICD-10-CM | POA: Diagnosis not present

## 2020-10-21 DIAGNOSIS — B351 Tinea unguium: Secondary | ICD-10-CM | POA: Diagnosis not present

## 2020-10-21 DIAGNOSIS — Z566 Other physical and mental strain related to work: Secondary | ICD-10-CM | POA: Diagnosis not present

## 2020-10-21 LAB — COMPREHENSIVE METABOLIC PANEL
ALT: 25 IU/L (ref 0–44)
AST: 30 IU/L (ref 0–40)
Albumin/Globulin Ratio: 1.9 (ref 1.2–2.2)
Albumin: 4.7 g/dL (ref 3.8–4.8)
Alkaline Phosphatase: 53 IU/L (ref 44–121)
BUN/Creatinine Ratio: 13 (ref 10–24)
BUN: 13 mg/dL (ref 8–27)
Bilirubin Total: 0.4 mg/dL (ref 0.0–1.2)
CO2: 24 mmol/L (ref 20–29)
Calcium: 9.5 mg/dL (ref 8.6–10.2)
Chloride: 102 mmol/L (ref 96–106)
Creatinine, Ser: 0.97 mg/dL (ref 0.76–1.27)
GFR calc Af Amer: 94 mL/min/{1.73_m2} (ref >59)
GFR calc non Af Amer: 81 mL/min/{1.73_m2} (ref >59)
Globulin, Total: 2.5 g/dL (ref 1.5–4.5)
Glucose: 85 mg/dL (ref 65–99)
Potassium: 5 mmol/L (ref 3.5–5.2)
Sodium: 142 mmol/L (ref 134–144)
Total Protein: 7.2 g/dL (ref 6.0–8.5)

## 2020-10-21 NOTE — Progress Notes (Signed)
   Subjective:    Patient ID: Rodney Wade, male    DOB: [birth year], 65 y.o.   MRN: [id number]  HPI He is here for recheck.  He is doing well on the Lamisil and having no difficulty with that.  He thinks it has a benefit effect on his toenails.  He also admits to being under a lot of stress dealing with Covid, his clients.  He admits to being an extrovert and Covid is interfering with his socializing.  He does feel some anxiety and states that it is also interfered slightly with his sleeping habits and that he does wake up periodically through the night.   Review of Systems     Objective:   Physical Exam Alert and in no distress with appropriate affect.  Exam of his toenails does show at least 50% clearance.       Assessment & Plan:  Onychomycosis - Plan: Comprehensive metabolic panel  Encounter for long-term (current) use of medications - Plan: Comprehensive metabolic panel  Stress at work He will continue on the Lamisil. I then discussed the stress that he is under he.  Discussed the use of melatonin to help with his sleep.  We discussed the use of BuSpar but at this time would like to hold off on that seeing if better sleeping habits would improve his ability to handle stress.  Also discussed getting him through the winter months and hopefully in the spring better weather as well as reduction of constraints from Covid will occur.  He was comfortable with that.

## 2020-11-15 ENCOUNTER — Encounter: Payer: Self-pay | Admitting: Family Medicine

## 2020-11-25 ENCOUNTER — Ambulatory Visit (INDEPENDENT_AMBULATORY_CARE_PROVIDER_SITE_OTHER): Payer: BC Managed Care – PPO | Admitting: Medical

## 2020-11-25 ENCOUNTER — Encounter: Payer: Self-pay | Admitting: Medical

## 2020-11-25 ENCOUNTER — Other Ambulatory Visit: Payer: Self-pay

## 2020-11-25 VITALS — BP 118/80 | HR 80 | Ht 66.0 in | Wt 190.2 lb

## 2020-11-25 DIAGNOSIS — H01001 Unspecified blepharitis right upper eyelid: Secondary | ICD-10-CM

## 2020-11-25 DIAGNOSIS — H02889 Meibomian gland dysfunction of unspecified eye, unspecified eyelid: Secondary | ICD-10-CM | POA: Diagnosis not present

## 2020-11-25 MED ORDER — ERYTHROMYCIN 5 MG/GM OP OINT: 1.0000 "application " | TOPICAL_OINTMENT | Freq: Two times a day (BID) | OPHTHALMIC | 0 refills | Status: DC

## 2020-11-25 NOTE — Progress Notes (Signed)
Subjective:  Rodney Wade is a 65 y.o. male who presents for Chief Complaint  Patient presents with  . Eye Problem    Pt present for swollen right eye lid      Here for right eye issue.  Started 5 days ago with redness and swelling of the right upper eyelid.  Somewhat tender.  No watery eyes, no eye drainage, no vision change.  No prior similar.  Otherwise normal state of health.  Using some warm compresses.  No other aggravating or relieving factors.    No other c/o.  The following portions of the patient's history were reviewed and updated as appropriate: allergies, current medications, past family history, past medical history, past social history, past surgical history and problem list.  ROS Otherwise as in subjective above  Objective: BP 118/80   Pulse 80   Ht 5\' 6"  (1.676 m)   Wt 190 lb 3.2 oz (86.3 kg)   SpO2 98%   BMI 30.70 kg/m   General appearance: alert, no distress, well developed, well nourished Right upper eyelid with with erythema and swelling, there appears to be 2 meibomian glands backed up Right eye conjunctival erythema, otherwise PERRLA, EOMI HEENT is otherwise unremarkable    Assessment: Encounter Diagnoses  Name Primary?  . Meibomian gland dysfunction Yes  . Blepharitis of right upper eyelid, unspecified type      Plan: Begin ointment below, discussed proper use of ointment, good hygiene, warm compresses.  If not improving within the next 5 to 7 days then call eye doctor for appointment.  If worse in the meantime call or recheck  Rodney Wade was seen today for eye problem.  Diagnoses and all orders for this visit:  Meibomian gland dysfunction  Blepharitis of right upper eyelid, unspecified type  Other orders -     erythromycin ophthalmic ointment; Place 1 application into the right eye in the morning and at bedtime.    Follow up: prn

## 2020-12-06 ENCOUNTER — Other Ambulatory Visit: Payer: Self-pay | Admitting: Family Medicine

## 2020-12-06 DIAGNOSIS — I10 Essential (primary) hypertension: Secondary | ICD-10-CM

## 2020-12-18 ENCOUNTER — Encounter: Payer: Self-pay | Admitting: Family Medicine

## 2020-12-19 ENCOUNTER — Other Ambulatory Visit: Payer: Self-pay

## 2020-12-19 DIAGNOSIS — I10 Essential (primary) hypertension: Secondary | ICD-10-CM

## 2020-12-19 MED ORDER — AMLODIPINE BESYLATE 10 MG PO TABS: 10.0000 mg | ORAL_TABLET | Freq: Every day | ORAL | 1 refills | Status: DC

## 2021-01-08 ENCOUNTER — Encounter: Payer: Self-pay | Admitting: Family Medicine

## 2021-01-08 DIAGNOSIS — B351 Tinea unguium: Secondary | ICD-10-CM

## 2021-01-08 MED ORDER — TERBINAFINE HCL 250 MG PO TABS: 250.0000 mg | ORAL_TABLET | Freq: Every day | ORAL | 0 refills | Status: DC

## 2021-02-16 ENCOUNTER — Encounter: Payer: Self-pay | Admitting: Family Medicine

## 2021-02-18 ENCOUNTER — Other Ambulatory Visit: Payer: Self-pay

## 2021-02-18 ENCOUNTER — Encounter: Payer: Self-pay | Admitting: Family Medicine

## 2021-02-18 ENCOUNTER — Ambulatory Visit (INDEPENDENT_AMBULATORY_CARE_PROVIDER_SITE_OTHER): Payer: BC Managed Care – PPO | Admitting: Family Medicine

## 2021-02-18 ENCOUNTER — Ambulatory Visit
Admission: RE | Admit: 2021-02-18 | Discharge: 2021-02-18 | Disposition: A | Discharge status: Home / Self Care | Payer: Medicare Other | Source: Ambulatory Visit | Attending: Family Medicine | Admitting: Family Medicine

## 2021-02-18 VITALS — BP 138/88 | HR 84 | Temp 98.4°F | Ht 67.0 in | Wt 187.6 lb

## 2021-02-18 DIAGNOSIS — M25562 Pain in left knee: Secondary | ICD-10-CM

## 2021-02-18 DIAGNOSIS — M25462 Effusion, left knee: Secondary | ICD-10-CM

## 2021-02-18 MED ORDER — COLCHICINE 0.6 MG PO TABS: 0.6000 mg | ORAL_TABLET | Freq: Two times a day (BID) | ORAL | 0 refills | Status: DC

## 2021-02-18 NOTE — Progress Notes (Signed)
   Subjective:    Patient ID: Rodney Wade, male    DOB: 08-05-1956, 65 y.o.   MRN: [id number]  HPI He complains of a 1 week history of left knee pain and swelling.  No other joints are involved.  No history of injury or overuse.  There is a questionable history of gout several years ago but he states his symptoms went away after he had surgery.  He has been taking 800 mg ibuprofen daily.  Review of Systems     Objective:   Physical Exam Left knee exam does show an effusion present.  The joint is not hot or tender.  Anterior drawer negative.  Medial lateral collateral ligaments intact.  McMurray's testing negative.       Assessment & Plan:  Acute pain of left knee - Plan: DG Knee Complete 4 Views Left, Body fluid culture, Cell Ct, Synovial w/o Crystals, Synovial Fluid Panel, colchicine 0.6 MG tablet  Effusion of left knee - Plan: Body fluid culture, Cell Ct, Synovial w/o Crystals, Synovial Fluid Panel, colchicine 0.6 MG tablet The left knee was prepped in a superior lateral area.  The patella was identified.  Xylocaine was injected into the skin.  10 cc of serosanguineous fluid was removed from the joint.  He was set up for culture, sensitivity, cell count and crystal analysis.  X-rays were ordered.  I will give him colchicine.  Explained that this could possibly be gout, pseudogout or possibly some other issue.

## 2021-02-19 ENCOUNTER — Encounter: Payer: Self-pay | Admitting: Family Medicine

## 2021-02-20 NOTE — Progress Notes (Signed)
The blood work is normal 

## 2021-02-20 NOTE — Progress Notes (Signed)
Pt was advised KH 

## 2021-02-20 NOTE — Progress Notes (Signed)
Pt was advised Rodney Wade 

## 2021-02-21 MED ORDER — ERYTHROMYCIN 5 MG/GM OP OINT: 1.0000 "application " | TOPICAL_OINTMENT | Freq: Three times a day (TID) | OPHTHALMIC | 0 refills | Status: DC

## 2021-02-22 LAB — SYNOVIAL FLUID PANEL
Eos, Fluid: 0 %
Glucose, Fluid: 100 mg/dL
Lining Cells, Synovial: 0 %
Lymphs, Fluid: 8 %
Macrophages Fld: 2 %
Nuc cell # Fld: 341 cells/uL — ABNORMAL HIGH (ref 0–200)
Polys, Fluid: 90 %
Protein, Fluid: 3.4 g/dL
RBC, Fluid: 27000 /uL

## 2021-02-27 ENCOUNTER — Encounter: Payer: Self-pay | Admitting: Family Medicine

## 2021-03-03 ENCOUNTER — Other Ambulatory Visit: Payer: Self-pay

## 2021-03-03 ENCOUNTER — Ambulatory Visit (INDEPENDENT_AMBULATORY_CARE_PROVIDER_SITE_OTHER): Payer: Medicare Other | Admitting: Family Medicine

## 2021-03-03 ENCOUNTER — Encounter: Payer: Self-pay | Admitting: Family Medicine

## 2021-03-03 VITALS — BP 138/82 | HR 89 | Temp 97.4°F | Wt 187.4 lb

## 2021-03-03 DIAGNOSIS — M25462 Effusion, left knee: Secondary | ICD-10-CM | POA: Diagnosis not present

## 2021-03-03 DIAGNOSIS — M25562 Pain in left knee: Secondary | ICD-10-CM | POA: Diagnosis not present

## 2021-03-03 NOTE — Progress Notes (Signed)
   Subjective:    Patient ID: Rodney Wade, male    DOB: Oct 06, 1955, 65 y.o.   MRN: [id number]  HPI He is here for a recheck on his left knee pain.  He states that is less bothersome but still causing trouble and is now noticing some more medial joint line tenderness.  X-rays done on his last visit were negative.  With fluid analysis was negative except for red cells.   Review of Systems     Objective:   Physical Exam Left knee exam does show a small effusion present.  Tender to palpation over the medial joint line.  McMurray's testing negative.  Negative anterior drawer.  Medial and lateral collateral ligaments are intact.       Assessment & Plan:  Acute pain of left knee  Effusion of left knee I explained that since the x-ray and joint analysis was negative, not unreasonable to give a joint injection with steroid to see if it will help.  Explained the fact that it still is somewhat problematic that he had blood in his joint fluid.  If this does not work, will refer to orthopedics.  He was comfortable with that. The left knee was prepped with Betadine.  The joint line was identified.  40 mg of Kenalog and 3 cc of Xylocaine was injected into the joint without difficulty.  He tolerated the procedure well.  He did start to see some benefit from the pain relatively quickly.

## 2021-03-19 ENCOUNTER — Other Ambulatory Visit: Payer: Self-pay

## 2021-03-19 ENCOUNTER — Encounter: Payer: Self-pay | Admitting: Family Medicine

## 2021-03-19 DIAGNOSIS — I10 Essential (primary) hypertension: Secondary | ICD-10-CM

## 2021-03-19 MED ORDER — AMLODIPINE BESYLATE 10 MG PO TABS: 10.0000 mg | ORAL_TABLET | Freq: Every day | ORAL | 1 refills | Status: DC

## 2021-03-20 ENCOUNTER — Other Ambulatory Visit: Payer: Self-pay | Admitting: Family Medicine

## 2021-03-20 DIAGNOSIS — M25562 Pain in left knee: Secondary | ICD-10-CM

## 2021-03-20 DIAGNOSIS — I10 Essential (primary) hypertension: Secondary | ICD-10-CM

## 2021-03-20 MED ORDER — AMLODIPINE BESYLATE 10 MG PO TABS: 10.0000 mg | ORAL_TABLET | Freq: Every day | ORAL | 1 refills | Status: DC

## 2021-04-07 ENCOUNTER — Ambulatory Visit (INDEPENDENT_AMBULATORY_CARE_PROVIDER_SITE_OTHER): Payer: Medicare Other | Admitting: Orthopaedic Surgery

## 2021-04-07 ENCOUNTER — Other Ambulatory Visit: Payer: Self-pay

## 2021-04-07 DIAGNOSIS — M25562 Pain in left knee: Secondary | ICD-10-CM | POA: Diagnosis not present

## 2021-04-07 NOTE — Progress Notes (Signed)
Office Visit Note   Patient: Rodney Wade           Date of Birth: September 09, 1956           MRN: [id number] Visit Date: 04/07/2021              Requested by: Denita Lung, MD [place],  [place] [id number] PCP: Denita Lung, MD   Assessment & Plan: Visit Diagnoses:  1. Acute pain of left knee     Plan: The patient does have significant mechanical symptoms of his left knee and given the failed conservative treatment combined with the mechanical symptoms, there is high suspicion for a meniscal tear.  A MRI at this point is warranted for his left knee.  He is already worked on Forensic scientist exercises and rest and anti-inflammatories.  He has had a steroid injection in the left knee and this did not help.  He may even have a subchondral stress fracture based on where I am sensing the pain in the medial compartment of his left knee.  I was able to aspirate about 10 cc of clear fluid from the left knee.  I have recommended Voltaren gel and we will order MRI of the left knee to rule out a medial meniscal tear combined with a stress fracture and we can better assess his cartilage.  He agrees with this treatment plan.  Follow-Up Instructions: No follow-ups on file.   Orders:  No orders of the defined types were placed in this encounter.  No orders of the defined types were placed in this encounter.     Procedures: No procedures performed   Clinical Data: No additional findings.   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  He has no known injury and has never had surgery on that knee.  The knee has been popping and swelling.  He actually saw Dr. Redmond School who drew fluid off the knee and placed a steroid injection in his left knee.  X-rays were negative for any type of acute injury and had a  well-maintained joint space.  The steroid injection only worked for about a day or 2.  The aspiration was negative for gout.  He has been taking ibuprofen and Tylenol and icing his knee.  He is walking with a limp.  He has been sitting for a while and goes to get up the knee is very stiff and painful.  He is not a diabetic.  HPI  Review of Systems   Objective: Vital Signs: There were no vitals taken for this visit.  Physical Exam He is alert and orient x3 and in no acute distress.  He is walking with a limp. Ortho Exam Examination of his left knee shows slight varus malalignment that is only slight.  There is a mild effusion with the left knee.  He has severe pain in the medial compartment past 90 degrees of flexion of that left knee.  His Lachman's test is negative but his McMurray's exam is positive to the medial compartment of the knee. Specialty Comments:  No specialty comments available.  Imaging: No results found. Independent review of x-rays of the left knee show no acute findings.  The medial lateral compartment joint spaces are very well-maintained.  The alignment is neutral.  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   Hx of adenomatous colonic polyps 04/08/2016   Obesity (BMI 30-39.9) 04/04/2012   Hypertension 09/21/2011   History of migraine headaches 09/21/2011   Past Medical History:  Diagnosis Date   Gout    Heart murmur    HH (hiatus hernia)    History of benign essential tremor    Hypertension    Migraine headache     Family History  Problem Relation Age of Onset   Hypertension Mother    Hypertension Father    Colon cancer Neg Hx    Allergic rhinitis Neg Hx    Angioedema Neg Hx    Asthma Neg Hx    Eczema Neg Hx    Colon polyps Neg Hx    Stomach cancer Neg Hx    Esophageal cancer Neg Hx     Past  Surgical History:  Procedure Laterality Date   bone spur removal Right 2006   COLONOSCOPY     GANGLION CYST EXCISION     Social History   Occupational History   Not on file  Tobacco Use   Smoking status: Never   Smokeless tobacco: Never  Substance and Sexual Activity   Alcohol use: Yes    Alcohol/week: 4.0 standard drinks    Types: 4 Glasses of wine per week    Comment: occasional   Drug use: No   Sexual activity: Yes

## 2021-04-09 ENCOUNTER — Other Ambulatory Visit: Payer: Self-pay

## 2021-04-09 ENCOUNTER — Telehealth: Payer: Self-pay | Admitting: Orthopaedic Surgery

## 2021-04-09 ENCOUNTER — Encounter: Payer: Self-pay | Admitting: Orthopaedic Surgery

## 2021-04-09 DIAGNOSIS — M25562 Pain in left knee: Secondary | ICD-10-CM

## 2021-04-09 NOTE — Telephone Encounter (Signed)
Pt called stating he was supposed to have a referral for an MRI of his L knee put in. Pt states he called [facility] and they dont have anything; pt would like a CB when the referral has been faxed.   [phone]

## 2021-04-09 NOTE — Telephone Encounter (Signed)
Please place order,thanks.

## 2021-04-21 ENCOUNTER — Other Ambulatory Visit: Payer: Self-pay | Admitting: Family Medicine

## 2021-04-21 ENCOUNTER — Ambulatory Visit
Admission: RE | Admit: 2021-04-21 | Discharge: 2021-04-21 | Disposition: A | Discharge status: Home / Self Care | Payer: Medicare Other | Source: Ambulatory Visit | Attending: Orthopaedic Surgery | Admitting: Orthopaedic Surgery

## 2021-04-21 DIAGNOSIS — M25562 Pain in left knee: Secondary | ICD-10-CM | POA: Diagnosis not present

## 2021-04-21 DIAGNOSIS — B351 Tinea unguium: Secondary | ICD-10-CM

## 2021-04-21 MED ORDER — TERBINAFINE HCL 250 MG PO TABS: 250.0000 mg | ORAL_TABLET | Freq: Every day | ORAL | 0 refills | Status: DC

## 2021-04-21 NOTE — Telephone Encounter (Signed)
Pt is requesting a refill on lamisil to be sent over to [facility]. Please advise Kh

## 2021-05-05 ENCOUNTER — Ambulatory Visit: Payer: Medicare Other | Admitting: Orthopaedic Surgery

## 2021-05-14 ENCOUNTER — Encounter: Payer: Self-pay | Admitting: Family Medicine

## 2021-05-14 ENCOUNTER — Ambulatory Visit (INDEPENDENT_AMBULATORY_CARE_PROVIDER_SITE_OTHER): Payer: Medicare Other | Admitting: Orthopaedic Surgery

## 2021-05-14 ENCOUNTER — Encounter: Payer: Self-pay | Admitting: Orthopaedic Surgery

## 2021-05-14 ENCOUNTER — Other Ambulatory Visit: Payer: Self-pay

## 2021-05-14 DIAGNOSIS — M1712 Unilateral primary osteoarthritis, left knee: Secondary | ICD-10-CM | POA: Insufficient documentation

## 2021-05-14 DIAGNOSIS — G8929 Other chronic pain: Secondary | ICD-10-CM | POA: Diagnosis not present

## 2021-05-14 DIAGNOSIS — M25562 Pain in left knee: Secondary | ICD-10-CM | POA: Diagnosis not present

## 2021-05-14 DIAGNOSIS — S83232D Complex tear of medial meniscus, current injury, left knee, subsequent encounter: Secondary | ICD-10-CM

## 2021-05-14 NOTE — Progress Notes (Signed)
The patient is a 65 year old gentleman who is coming in for follow-up after having a MRI of his left knee due to continued occasional pain with that knee and third conservative treatment.  He says the pain comes and goes and is worse in the morning.  His left knee does get stiff and still swells.  He is never had surgery on the knee either.  I went over the MRI with him with his left knee.  There is areas of full-thickness cartilage loss of the medial compartment the knee with subchondral edema.  He has tearing of the medial and lateral meniscus and cartilage thinning at the patellofemoral joint and the lateral compartment of the knee as well.  On exam but he does have an effusion with some varus malalignment.  He has good range of motion of his left knee.  At this point given the significant tricompartment arthritis with his left knee my recommendation would be a knee replacement as opposed to knee arthroscopic surgery.  I do not think arthroscopic surgery will help him and he does not have really mechanical symptoms of locking and catching.  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  I showed him a knee replacement model and described in detail what the surgery involves.  We talked about the risks and benefits of surgery and what to expect during his intraoperative and postoperative course.  He does live alone and in the condominium that is on the second floor.  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  Logistically this will be tough for him and he is going to think about it and figure things out.  I answers most questions as I could and he can always call me again.  Also gave him my surgery scheduler's card.

## 2021-05-26 ENCOUNTER — Other Ambulatory Visit: Payer: Self-pay

## 2021-05-26 ENCOUNTER — Encounter: Payer: Self-pay | Admitting: Family Medicine

## 2021-05-26 ENCOUNTER — Telehealth (INDEPENDENT_AMBULATORY_CARE_PROVIDER_SITE_OTHER): Payer: Medicare Other | Admitting: Family Medicine

## 2021-05-26 VITALS — Ht 67.0 in | Wt 179.5 lb

## 2021-05-26 DIAGNOSIS — M25562 Pain in left knee: Secondary | ICD-10-CM | POA: Diagnosis not present

## 2021-05-26 DIAGNOSIS — M23307 Other meniscus derangements, unspecified meniscus, left knee: Secondary | ICD-10-CM

## 2021-05-26 NOTE — Progress Notes (Signed)
   Subjective:    Patient ID: Rodney Wade, male    DOB: Mar 02, 1956, 65 y.o.   MRN: [id number]  HPI Documentation for virtual audio and video telecommunications through [facility] encounter:  The patient was located at home. 2 patient identifiers used.  The provider was located in the office. The patient did consent to this visit and is aware of possible charges through their insurance for this visit. The other persons participating in this telemedicine service were none. Time spent on call was 6 minutes and in review of previous records >15  minutes total for counseling and coordination of care.  This virtual service is not related to other E/M service within previous 7 days. He has had difficulty over the last several months with left knee pain.  He has had an injection by meOver the last several months with left knee pain.  He has had an injection by me and by Dr. Ninfa Linden both of which lasted a very short period of time.  An MRI was ordered by Dr. Ninfa Linden which did show extensive meniscal damage but no major bony abnormalities.  Dr. Ninfa Linden recommended knee replacement and he is interested in getting a second opinion.  Review of Systems     Objective:   Physical Exam Alert and in no distress otherwise not examined       Assessment & Plan:  Acute pain of left knee - Plan: Ambulatory referral to Orthopedic Surgery, Ambulatory referral to Physical Therapy  Meniscus degeneration, left - Plan: Ambulatory referral to Orthopedic Surgery, Ambulatory referral to Physical Therapy I explained that since he has had 2 injections which gave him very minimal relief, the only other nonsurgical thing I can think of would be to do a PT program.  He would also like to be referred to Dr. Wynelle Link to get his opinion on this and I will make that referral as well.

## 2021-06-09 DIAGNOSIS — M25562 Pain in left knee: Secondary | ICD-10-CM | POA: Diagnosis not present

## 2021-06-21 ENCOUNTER — Other Ambulatory Visit: Payer: Self-pay | Admitting: Family Medicine

## 2021-06-21 DIAGNOSIS — I10 Essential (primary) hypertension: Secondary | ICD-10-CM

## 2021-06-23 DIAGNOSIS — M25562 Pain in left knee: Secondary | ICD-10-CM | POA: Diagnosis not present

## 2021-06-26 ENCOUNTER — Encounter: Payer: Self-pay | Admitting: Family Medicine

## 2021-06-26 DIAGNOSIS — I10 Essential (primary) hypertension: Secondary | ICD-10-CM

## 2021-06-27 ENCOUNTER — Other Ambulatory Visit: Payer: Self-pay

## 2021-06-27 DIAGNOSIS — I10 Essential (primary) hypertension: Secondary | ICD-10-CM

## 2021-06-27 MED ORDER — AMLODIPINE BESYLATE 10 MG PO TABS: ORAL_TABLET | ORAL | 1 refills | Status: DC

## 2021-07-01 ENCOUNTER — Other Ambulatory Visit: Payer: Self-pay | Admitting: Family Medicine

## 2021-07-01 DIAGNOSIS — I10 Essential (primary) hypertension: Secondary | ICD-10-CM

## 2021-07-14 ENCOUNTER — Other Ambulatory Visit: Payer: Self-pay

## 2021-07-14 ENCOUNTER — Encounter: Payer: Self-pay | Admitting: Family Medicine

## 2021-07-14 ENCOUNTER — Ambulatory Visit (INDEPENDENT_AMBULATORY_CARE_PROVIDER_SITE_OTHER): Payer: BC Managed Care – PPO | Admitting: Family Medicine

## 2021-07-14 VITALS — BP 120/76 | HR 88 | Temp 98.1°F | Ht 66.0 in | Wt 184.4 lb

## 2021-07-14 DIAGNOSIS — L570 Actinic keratosis: Secondary | ICD-10-CM | POA: Diagnosis not present

## 2021-07-14 DIAGNOSIS — Z13220 Encounter for screening for lipoid disorders: Secondary | ICD-10-CM

## 2021-07-14 DIAGNOSIS — M1712 Unilateral primary osteoarthritis, left knee: Secondary | ICD-10-CM

## 2021-07-14 DIAGNOSIS — I350 Nonrheumatic aortic (valve) stenosis: Secondary | ICD-10-CM

## 2021-07-14 DIAGNOSIS — I10 Essential (primary) hypertension: Secondary | ICD-10-CM

## 2021-07-14 DIAGNOSIS — Z8669 Personal history of other diseases of the nervous system and sense organs: Secondary | ICD-10-CM | POA: Diagnosis not present

## 2021-07-14 DIAGNOSIS — E669 Obesity, unspecified: Secondary | ICD-10-CM

## 2021-07-14 DIAGNOSIS — Z125 Encounter for screening for malignant neoplasm of prostate: Secondary | ICD-10-CM

## 2021-07-14 DIAGNOSIS — Z0000 Encounter for general adult medical examination without abnormal findings: Secondary | ICD-10-CM | POA: Diagnosis not present

## 2021-07-14 DIAGNOSIS — Z23 Encounter for immunization: Secondary | ICD-10-CM | POA: Diagnosis not present

## 2021-07-14 DIAGNOSIS — Z86010 Personal history of colonic polyps: Secondary | ICD-10-CM | POA: Diagnosis not present

## 2021-07-14 MED ORDER — LOSARTAN POTASSIUM-HCTZ 100-12.5 MG PO TABS: 1.0000 | ORAL_TABLET | Freq: Every day | ORAL | 1 refills | Status: DC

## 2021-07-14 MED ORDER — AMLODIPINE BESYLATE 10 MG PO TABS: ORAL_TABLET | ORAL | 1 refills | Status: DC

## 2021-07-14 NOTE — Progress Notes (Signed)
Rodney Wade is a 65 y.o. male who presents for annual wellness visit and follow-up on chronic medical conditions.  He has no particular concerns or complaints.  He continues on his blood pressure medications and is having no difficulty with them.  He continues to workout regularly and has lost weight.  He is very happy with that.  He does have a history of colonic polyps and is scheduled for routine follow-up on a 3-year cycle.  He continues to have knee pain but rehab has helped.  He is scheduled for a second opinion concerning possible joint replacement.  He also has lesions on his scalp that he would like evaluated.  He does have a history of migraine headaches but has not had any difficulty with that quite some time.  Does have a history of aortic stenosis but is having no chest pain, shortness of breath, weakness, syncopal spells.  His work and home life are going well.   Immunizations and Health Maintenance Immunization History  Administered Date(s) Administered   Fluad Quad(high Dose 65+) 06/13/2019, 07/08/2020   Influenza Split 09/21/2011   Influenza Whole 06/30/2010   Influenza,inj,Quad PF,6+ Mos 05/27/2017, 05/31/2018   PFIZER(Purple Top)SARS-COV-2 Vaccination 10/04/2019, 10/23/2019, 06/18/2020   Pfizer Covid-19 Vaccine Bivalent Booster 75yrs & up 06/03/2021   Pneumococcal Conjugate-13 06/13/2019   Pneumococcal Polysaccharide-23 07/08/2020   Tdap 04/14/2009, 07/04/2019   Zoster Recombinat (Shingrix) 03/02/2017, 05/27/2017   Zoster, Live 04/08/2015   Health Maintenance Due  Topic Date Due   INFLUENZA VACCINE  04/14/2021    Last colonoscopy: 04/08/20 Last PSA: N/A Dentist: Q six months  Ophtho: over two years Exercise: work out at least five days a week   Other doctors caring for patient include: Dr. Ninfa Linden ortho, Dr. Novella [facility]  Advanced Directives: Does Patient Have a Medical Advance Directive?: Yes Type of Advance Directive: Living will, Healthcare Power of  [place] in Chart?: Yes - validated most recent copy scanned in chart (See row information)  Depression screen:  See questionnaire below.     Depression screen [facility] 2/9 07/14/2021 07/08/2020 06/13/2019 06/13/2019 05/31/2018  Decreased Interest 0 0 0 0 0  Down, Depressed, Hopeless 0 1 0 0 0  PHQ - 2 Score 0 1 0 0 0    Fall Screen: See Questionaire below.   Fall Risk  07/14/2021 07/08/2020 06/13/2019  Falls in the past year? 0 1 0  Number falls in past yr: 0 0 -  Injury with Fall? 0 0 -  Risk for fall due to : No Fall Risks No Fall Risks -  Follow up Falls evaluation completed - -    ADL screen:  See questionnaire below.  Functional Status Survey: Is the patient deaf or have difficulty hearing?: No Does the patient have difficulty seeing, even when wearing glasses/contacts?: No Does the patient have difficulty concentrating, remembering, or making decisions?: No Does the patient have difficulty walking or climbing stairs?: No Does the patient have difficulty dressing or bathing?: No Does the patient have difficulty doing errands alone such as visiting a doctor's office or shopping?: No   Review of Systems  Constitutional: -, -unexpected weight change, -anorexia, -fatigue Allergy: -sneezing, -itching, -congestion Dermatology: denies changing moles, rash, lumps ENT: -runny nose, -ear pain, -sore throat,  Cardiology:  -chest pain, -palpitations, -orthopnea, Respiratory: -cough, -shortness of breath, -dyspnea on exertion, -wheezing,  Gastroenterology: -abdominal pain, -nausea, -vomiting, -diarrhea, -constipation, -dysphagia Hematology: -bleeding or bruising problems Musculoskeletal: -arthralgias, -myalgias, -joint swelling, -  back pain, - Ophthalmology: -vision changes,  Urology: -dysuria, -difficulty urinating,  -urinary frequency, -urgency, incontinence Neurology: -, -numbness, , -memory loss, -falls, -dizziness    PHYSICAL EXAM:  General  Appearance: Alert, cooperative, no distress, appears stated age Head: Normocephalic, without obvious abnormality, atraumatic Eyes: PERRL, conjunctiva/corneas clear, EOM's intact,  Ears: Normal TM's and external ear canals Nose: Nares normal, mucosa normal, no drainage or sinus   tenderness Throat: Lips, mucosa, and tongue normal; teeth and gums normal Neck: Supple, no lymphadenopathy, thyroid:no enlargement/tenderness/nodules; no carotid bruit or JVD Lungs: Clear to auscultation bilaterally without wheezes, rales or ronchi; respirations unlabored Heart: Regular rate and rhythm, S1 and S2 normal, 3/6 SEM heard best at right sternal border with radiation into the neck. Abdomen: Soft, non-tender, nondistended, normoactive bowel sounds, no masses, no hepatosplenomegaly Extremities: No clubbing, cyanosis or edema Pulses: 2+ and symmetric all extremities Skin: Skin color, texture, turgor normal except for on his scalp which does show chronic sun exposure changes with some areas of slight erythema and patchiness.   Lymph nodes: Cervical, supraclavicular, and axillary nodes normal Neurologic: CNII-XII intact, normal strength, sensation and gait; reflexes 2+ and symmetric throughout   Psych: Normal mood, affect, hygiene and grooming  ASSESSMENT/PLAN: Routine general medical examination at a health care facility - Plan: CBC with Differential/Platelet, Comprehensive metabolic panel, Lipid panel  Need for influenza vaccination - Plan: Flu Vaccine QUAD High Dose(Fluad)  Essential hypertension - Plan: losartan-hydrochlorothiazide (HYZAAR) 100-12.5 MG tablet, amLODipine (NORVASC) 10 MG tablet, CBC with Differential/Platelet, Comprehensive metabolic panel  Hx of adenomatous colonic polyps  History of migraine headaches  Obesity (BMI 30-39.9)  Aortic stenosis with trileaflet valve  Unilateral primary osteoarthritis, left knee  Screening for prostate cancer - Plan: PSA  Screening for lipid  disorders - Plan: Lipid panel  Actinic keratosis of scalp He will follow-up with GI as previously scheduled.  Continue on his present medications.  No therapy for the migraine headache.  I congratulated him on his weight loss.  Discussed follow-up echocardiogram and we will do this in another year or 2.  Discussed possible side effects of that. Discussed PSA screening (risks/benefits), recommended at least 30 minutes of aerobic activity at least 5 days/week; proper sunscreen use reviewed; healthy diet and alcohol recommendations (less than or equal to 2 drinks/day) reviewed; regular seatbelt use; changing batteries in smoke detectors. Immunization recommendations discussed.  Colonoscopy recommendations reviewed.   Medicare Attestation I have personally reviewed: The patient's medical and social history Their use of alcohol, tobacco or illicit drugs Their current medications and supplements The patient's functional ability including ADLs,fall risks, home safety risks, cognitive, and hearing and visual impairment Diet and physical activities Evidence for depression or mood disorders  The patient's weight, height, and BMI have been recorded in the chart.  I have made referrals, counseling, and provided education to the patient based on review of the above and I have provided the patient with a written personalized care plan for preventive services.     Jill Alexanders, MD   07/14/2021

## 2021-07-14 NOTE — Patient Instructions (Signed)
  Mr. Rodney Wade , Thank you for taking time to come for your Medicare Wellness Visit. I appreciate your ongoing commitment to your health goals. Please review the following plan we discussed and let me know if I can assist you in the future.   These are the goals we discussed:  Goals   None     This is a list of the screening recommended for you and due dates:  Health Maintenance  Topic Date Due   Flu Shot  04/14/2021   Colon Cancer Screening  04/09/2023   Tetanus Vaccine  07/03/2029   Pneumonia Vaccine  Completed   COVID-19 Vaccine  Completed   Hepatitis C Screening: USPSTF Recommendation to screen - Ages 18-79 yo.  Completed   Zoster (Shingles) Vaccine  Completed   HPV Vaccine  Aged Out

## 2021-07-15 LAB — PSA: Prostate Specific Ag, Serum: 0.4 ng/mL (ref 0.0–4.0)

## 2021-07-15 LAB — COMPREHENSIVE METABOLIC PANEL
ALT: 24 IU/L (ref 0–44)
AST: 30 IU/L (ref 0–40)
Albumin/Globulin Ratio: 2.4 — ABNORMAL HIGH (ref 1.2–2.2)
Albumin: 4.8 g/dL (ref 3.8–4.8)
Alkaline Phosphatase: 60 IU/L (ref 44–121)
BUN/Creatinine Ratio: 17 (ref 10–24)
BUN: 15 mg/dL (ref 8–27)
Bilirubin Total: 0.3 mg/dL (ref 0.0–1.2)
CO2: 27 mmol/L (ref 20–29)
Calcium: 9.3 mg/dL (ref 8.6–10.2)
Chloride: 100 mmol/L (ref 96–106)
Creatinine, Ser: 0.86 mg/dL (ref 0.76–1.27)
Globulin, Total: 2 g/dL (ref 1.5–4.5)
Glucose: 88 mg/dL (ref 70–99)
Potassium: 4.4 mmol/L (ref 3.5–5.2)
Sodium: 138 mmol/L (ref 134–144)
Total Protein: 6.8 g/dL (ref 6.0–8.5)
eGFR: 95 mL/min/{1.73_m2} (ref >59)

## 2021-07-15 LAB — CBC WITH DIFFERENTIAL/PLATELET
Basophils Absolute: 0 10*3/uL (ref 0.0–0.2)
Basos: 1 %
EOS (ABSOLUTE): 0.2 10*3/uL (ref 0.0–0.4)
Eos: 4 %
Hematocrit: 45 % (ref 37.5–51.0)
Hemoglobin: 15.4 g/dL (ref 13.0–17.7)
Immature Grans (Abs): 0 10*3/uL (ref 0.0–0.1)
Immature Granulocytes: 1 %
Lymphocytes Absolute: 2.2 10*3/uL (ref 0.7–3.1)
Lymphs: 35 %
MCH: 33 pg (ref 26.6–33.0)
MCHC: 34.2 g/dL (ref 31.5–35.7)
MCV: 97 fL (ref 79–97)
Monocytes Absolute: 0.7 10*3/uL (ref 0.1–0.9)
Monocytes: 11 %
Neutrophils Absolute: 3 10*3/uL (ref 1.4–7.0)
Neutrophils: 48 %
Platelets: 240 10*3/uL (ref 150–450)
RBC: 4.66 x10E6/uL (ref 4.14–5.80)
RDW: 12.8 % (ref 11.6–15.4)
WBC: 6.1 10*3/uL (ref 3.4–10.8)

## 2021-07-15 LAB — LIPID PANEL
Chol/HDL Ratio: 2.5 ratio (ref 0.0–5.0)
Cholesterol, Total: 202 mg/dL — ABNORMAL HIGH (ref 100–199)
HDL: 82 mg/dL (ref >39)
LDL Chol Calc (NIH): 100 mg/dL — ABNORMAL HIGH (ref 0–99)
Triglycerides: 113 mg/dL (ref 0–149)
VLDL Cholesterol Cal: 20 mg/dL (ref 5–40)

## 2021-07-25 DIAGNOSIS — M25562 Pain in left knee: Secondary | ICD-10-CM | POA: Diagnosis not present

## 2021-07-28 ENCOUNTER — Other Ambulatory Visit: Payer: Self-pay | Admitting: Family Medicine

## 2021-07-28 DIAGNOSIS — B351 Tinea unguium: Secondary | ICD-10-CM

## 2021-07-28 NOTE — Telephone Encounter (Signed)
[facility] Is requesting to fill pt lamisil. Please advise. kh

## 2021-07-29 MED ORDER — TERBINAFINE HCL 250 MG PO TABS: 250.0000 mg | ORAL_TABLET | Freq: Every day | ORAL | 0 refills | Status: DC

## 2021-09-20 ENCOUNTER — Encounter: Payer: Self-pay | Admitting: Family Medicine

## 2021-09-22 ENCOUNTER — Other Ambulatory Visit: Payer: Self-pay

## 2021-09-22 DIAGNOSIS — I10 Essential (primary) hypertension: Secondary | ICD-10-CM

## 2021-09-22 MED ORDER — AMLODIPINE BESYLATE 10 MG PO TABS
ORAL_TABLET | ORAL | 1 refills | Status: DC
Start: 2021-09-22 — End: 2022-01-30

## 2021-10-08 DIAGNOSIS — M25562 Pain in left knee: Secondary | ICD-10-CM | POA: Diagnosis not present

## 2021-11-03 ENCOUNTER — Other Ambulatory Visit: Payer: Self-pay | Admitting: Family Medicine

## 2021-11-03 DIAGNOSIS — B351 Tinea unguium: Secondary | ICD-10-CM

## 2021-11-03 MED ORDER — TERBINAFINE HCL 250 MG PO TABS: 250.0000 mg | ORAL_TABLET | Freq: Every day | ORAL | 0 refills | Status: DC

## 2021-11-03 NOTE — Telephone Encounter (Signed)
[facility] is requesting to fill pt lamisil. Please advise [facility]

## 2021-12-04 DIAGNOSIS — M25562 Pain in left knee: Secondary | ICD-10-CM | POA: Diagnosis not present

## 2021-12-26 ENCOUNTER — Encounter: Payer: Self-pay | Admitting: Family Medicine

## 2021-12-26 ENCOUNTER — Ambulatory Visit (INDEPENDENT_AMBULATORY_CARE_PROVIDER_SITE_OTHER): Payer: Medicare Other | Admitting: Family Medicine

## 2021-12-26 VITALS — BP 134/82 | HR 98 | Temp 98.1°F | Wt 187.6 lb

## 2021-12-26 DIAGNOSIS — B354 Tinea corporis: Secondary | ICD-10-CM | POA: Diagnosis not present

## 2021-12-26 NOTE — Patient Instructions (Signed)
Use Lamisil AF sparingly twice a day for 3 weeks and if it still there then call me and I will refer you ?

## 2021-12-26 NOTE — Progress Notes (Signed)
? ?  Subjective:  ? ? Patient ID: Rodney Wade, male    DOB: 1956/03/22, 67 y.o.   MRN: [id number] ? ?HPI ?He notes 2 round smooth pruritic lesion present on the left anterior abdominal area.  No lesions anywhere else. ?He has been on Lamisil to help with onychomycosis. ? ?Review of Systems ? ?   ?Objective:  ? Physical Exam ?2 lesions of approximately 1-1/2 x 2 cm in size slightly oblong in shape, pigmented confluently.  No other lesions are noted. ? ? ? ?   ?Assessment & Plan:  ?Tinea corporis ?Recommend using Lamisil AF topically for at least 3 weeks. ?I explained that I still think this is probably a tinea in spite of him being on Lamisil.  I will try topical but if this does not work I will refer to dermatology.  I explained that usually tinea has a central clearing which this 1 did not. ?

## 2022-01-02 ENCOUNTER — Other Ambulatory Visit: Payer: Self-pay | Admitting: Family Medicine

## 2022-01-02 DIAGNOSIS — I10 Essential (primary) hypertension: Secondary | ICD-10-CM

## 2022-01-06 ENCOUNTER — Encounter: Payer: Self-pay | Admitting: Family Medicine

## 2022-01-06 DIAGNOSIS — B354 Tinea corporis: Secondary | ICD-10-CM

## 2022-01-07 DIAGNOSIS — M25562 Pain in left knee: Secondary | ICD-10-CM | POA: Diagnosis not present

## 2022-01-09 DIAGNOSIS — L309 Dermatitis, unspecified: Secondary | ICD-10-CM | POA: Diagnosis not present

## 2022-01-12 ENCOUNTER — Encounter: Payer: Self-pay | Admitting: Family Medicine

## 2022-01-12 ENCOUNTER — Ambulatory Visit (INDEPENDENT_AMBULATORY_CARE_PROVIDER_SITE_OTHER): Payer: Medicare Other | Admitting: Family Medicine

## 2022-01-12 VITALS — BP 130/82 | HR 91 | Temp 97.2°F | Wt 187.4 lb

## 2022-01-12 DIAGNOSIS — F410 Panic disorder [episodic paroxysmal anxiety] without agoraphobia: Secondary | ICD-10-CM | POA: Diagnosis not present

## 2022-01-12 DIAGNOSIS — I350 Nonrheumatic aortic (valve) stenosis: Secondary | ICD-10-CM | POA: Diagnosis not present

## 2022-01-12 MED ORDER — ALPRAZOLAM 0.25 MG PO TABS: 0.2500 mg | ORAL_TABLET | Freq: Two times a day (BID) | ORAL | 0 refills | Status: DC | PRN

## 2022-01-12 NOTE — Progress Notes (Signed)
? ?  Subjective:  ? ? Patient ID: Rodney Wade, male    DOB: 1956-05-19, 66 y.o.   MRN: [id number] ? ?HPI ?He went to a music festival this weekend and did have 2 alcoholic beverages over a several hour period of time.  He then was given a CBD gummy to see if it would help with his arthritic symptoms.  Shortly after that he noted panicky type symptoms with increased heart rate, tingling sensation, feeling wobbly with tremors and dry mouth.  He states that he was unable to stand.  He was seen at the facility by EMS and apparently an EKG was done which did not show occasional PVC.  He was given a Xanax during the tail end of this and states that it did indeed help calm him down.  Since then he has improved a lot but is still quite distraught over the panic attack that he had.  He then mentioned the fact that he is under a lot of stress dealing with multiple issues in his life, none of which in the past have been a real issue. ? ? ?Review of Systems ? ?   ?Objective:  ? Physical Exam ?Alert and tearful.  Cardiac exam shows a 3/6 SEM.  Lungs are clear to auscultation. ?EKG read by me shows a sinus rate of 78 with parameters being totally normal. ? ? ?   ?Assessment & Plan:  ?Panic attack - Plan: ALPRAZolam (XANAX) 0.25 MG tablet ? ?Aortic stenosis with trileaflet valve - Plan: EKG 12-Lead ?I explained to the thought that his panic attack was secondary to alcohol use which was not significant added on top of trying CBD for the first time.  I explained the this particular CBD thing had more psychoactive component to it than it should have.  This has stressed him out and I explained that it also other stresses in his life are a bit more significant.  Xanax will be given.  He thinks that a more longer term use of an antianxiety might be necessary.  Encouraged him to potentially get involved in counseling and we can readdress this at a later date as at this point I do not think that appropriate.  He was comfortable  with that. ? ?

## 2022-01-13 ENCOUNTER — Ambulatory Visit: Payer: BC Managed Care – PPO | Admitting: Family Medicine

## 2022-01-16 ENCOUNTER — Encounter: Payer: Self-pay | Admitting: Family Medicine

## 2022-01-19 ENCOUNTER — Ambulatory Visit: Payer: BC Managed Care – PPO | Admitting: Family Medicine

## 2022-01-30 ENCOUNTER — Other Ambulatory Visit: Payer: Self-pay | Admitting: Family Medicine

## 2022-01-30 DIAGNOSIS — I10 Essential (primary) hypertension: Secondary | ICD-10-CM

## 2022-02-04 DIAGNOSIS — M1712 Unilateral primary osteoarthritis, left knee: Secondary | ICD-10-CM | POA: Diagnosis not present

## 2022-02-04 DIAGNOSIS — M170 Bilateral primary osteoarthritis of knee: Secondary | ICD-10-CM | POA: Diagnosis not present

## 2022-03-19 ENCOUNTER — Other Ambulatory Visit: Payer: Self-pay | Admitting: Family Medicine

## 2022-03-19 DIAGNOSIS — I10 Essential (primary) hypertension: Secondary | ICD-10-CM

## 2022-04-06 DIAGNOSIS — M1712 Unilateral primary osteoarthritis, left knee: Secondary | ICD-10-CM | POA: Diagnosis not present

## 2022-04-16 DIAGNOSIS — M1712 Unilateral primary osteoarthritis, left knee: Secondary | ICD-10-CM | POA: Diagnosis not present

## 2022-04-23 DIAGNOSIS — M1712 Unilateral primary osteoarthritis, left knee: Secondary | ICD-10-CM | POA: Diagnosis not present

## 2022-04-29 ENCOUNTER — Ambulatory Visit (INDEPENDENT_AMBULATORY_CARE_PROVIDER_SITE_OTHER): Payer: Medicare Other | Admitting: Family Medicine

## 2022-04-29 ENCOUNTER — Encounter: Payer: Self-pay | Admitting: Family Medicine

## 2022-04-29 VITALS — BP 118/80 | HR 80 | Temp 98.5°F | Wt 190.8 lb

## 2022-04-29 DIAGNOSIS — M549 Dorsalgia, unspecified: Secondary | ICD-10-CM

## 2022-04-29 NOTE — Progress Notes (Signed)
   Subjective:    Patient ID: Rodney Wade, male    DOB: [birth year], 66 y.o.   MRN: [id number]  HPI He states that 2 days ago he noted some mid back pain.  No history of injury or overuse.  The pain is made worse with motion but especially with breathing.  He has had no cough, shortness of breath, fever.  He describes the pain as a 3-4 out of 10.   Review of Systems     Objective:   Physical Exam Alert and complaining of back pain.  Slight pain on flexion and rotation of the back area.  Lungs are clear to auscultation.  No palpable tenderness.       Assessment & Plan:  Mid back pain - Plan: DG Chest 2 View I explained that at this point he has musculoskeletal as well as pleuritic type pain pattern and recommend Tylenol/Advil for pain relief.  He is set up for an x-ray that he can get it anytime.  He will try to take care of with pain medication first and has a backup of the x-ray.  He was comfortable with that.

## 2022-04-29 NOTE — Patient Instructions (Signed)
Take 2 Tylenol 4 times per day and you can take as many as 800 mg of ibuprofen 3 times per day.  If still having trouble get the x-ray

## 2022-05-07 ENCOUNTER — Ambulatory Visit: Payer: Medicare Other | Admitting: Family Medicine

## 2022-05-13 IMAGING — MR MR KNEE*L* W/O CM
4 of 7 series · 20 of 40 positions shown · non-contrast
Comparison: None.

CLINICAL DATA: Left knee pain for 6 months. No known injury.

EXAM:
MRI OF THE LEFT KNEE WITHOUT CONTRAST
TECHNIQUE: Multiplanar, multisequence MR imaging of the knee was performed. No
intravenous contrast was administered.

[Series 3: T2 fat-sat · axial · 4.0mm · 0.50mm/px · z∈[-67,+48]mm · 3 of 24 slices shown]
[im 1/24]
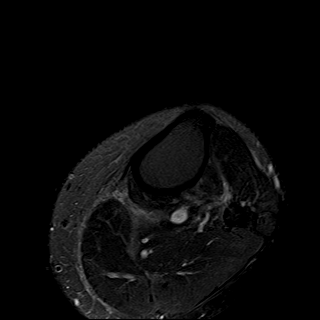
[im 12/24]
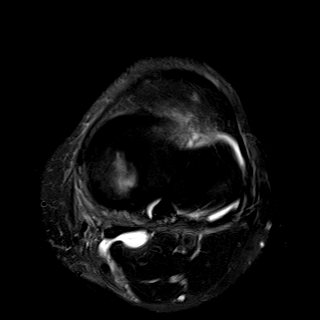
[im 24/24]
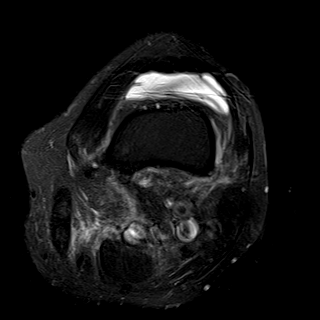

[Series 7: PD fat-sat · sagittal · 3.0mm · 0.29mm/px · 7 of 28 slices shown (1 of 3)]
[im 1/28]
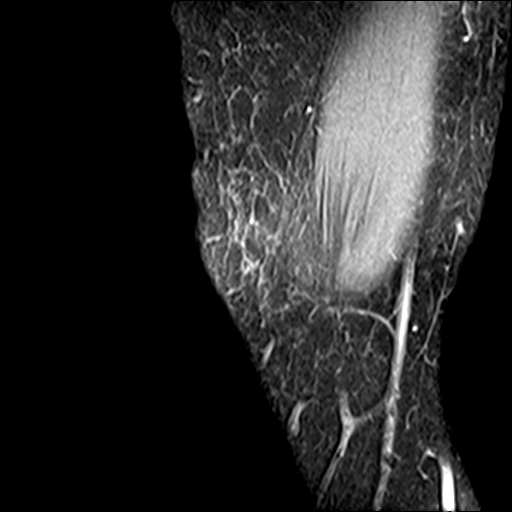
[im 5/28]
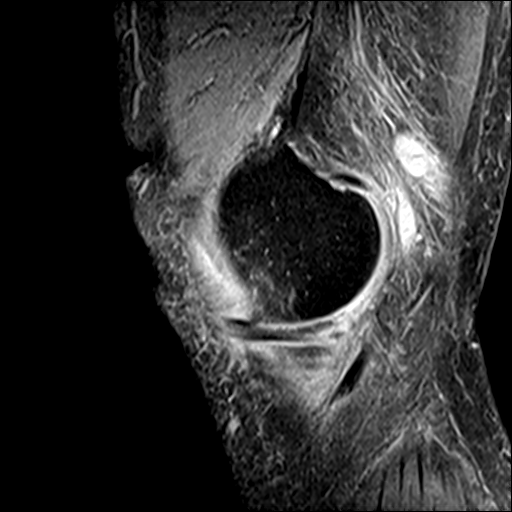
[im 10/28]
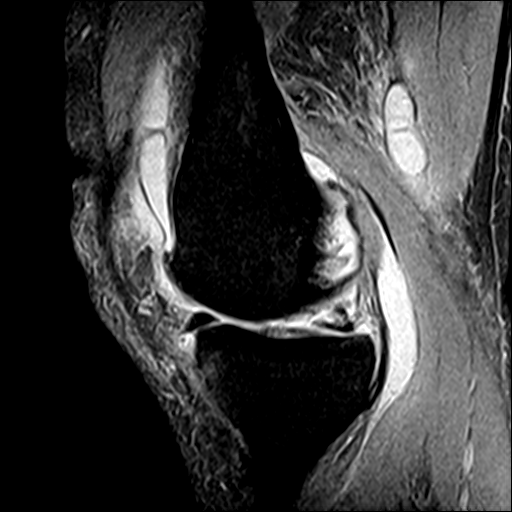
[im 14/28]
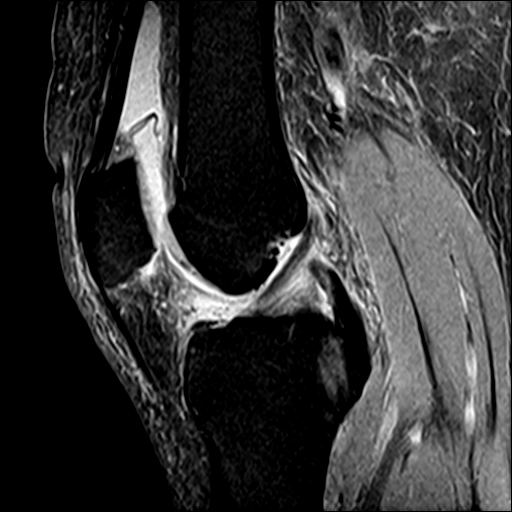
[im 19/28]
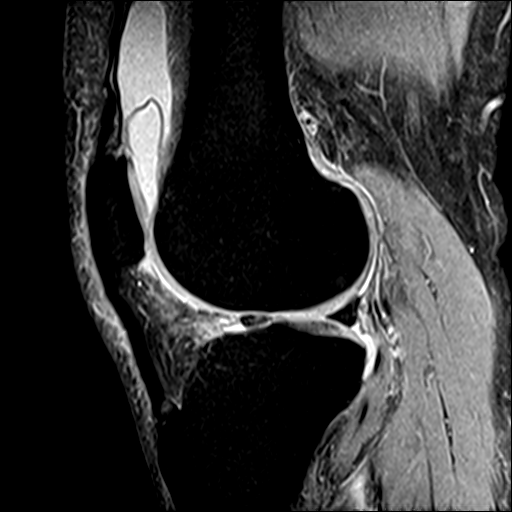
[im 23/28]
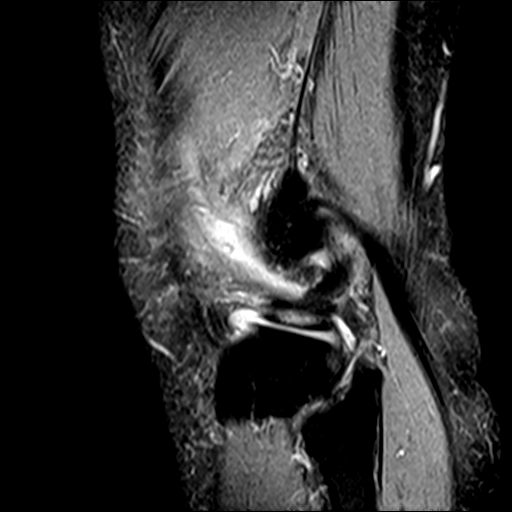
[im 28/28]
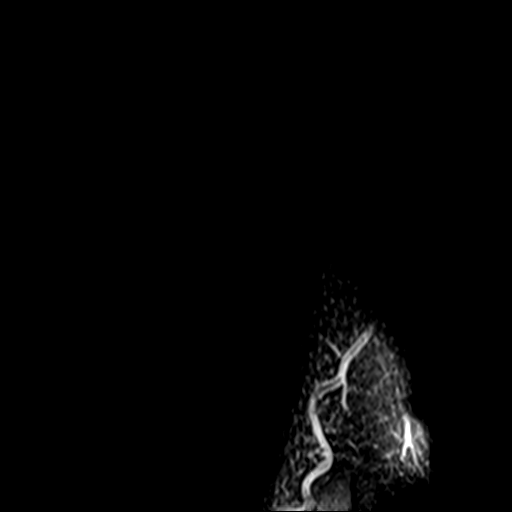

[Series 8: PD fat-sat · coronal · 3.0mm · 0.29mm/px · 7 of 28 slices shown (2 of 3)]
[im 1/28]
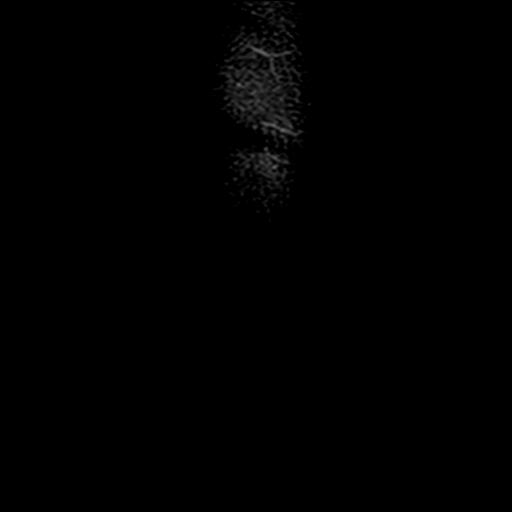
[im 5/28]
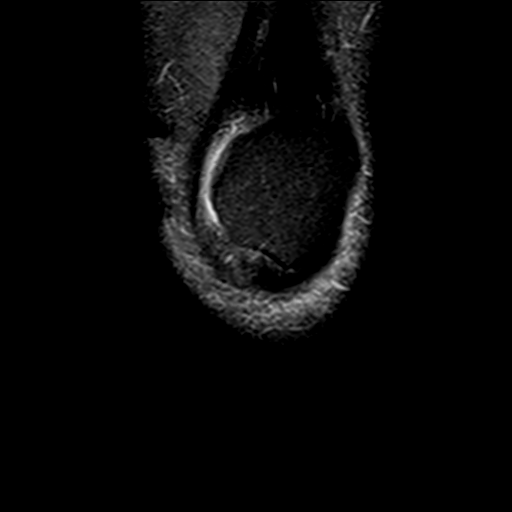
[im 10/28]
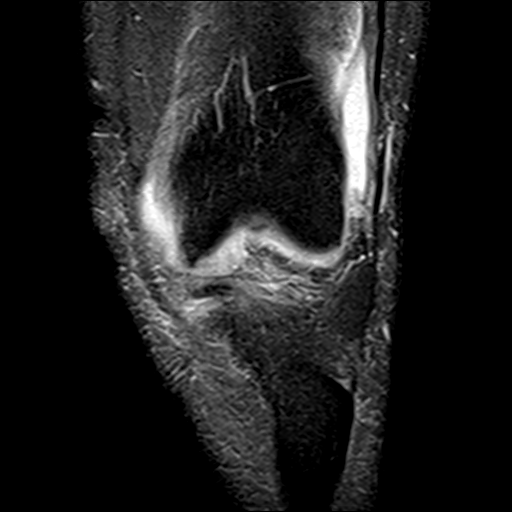
[im 14/28]
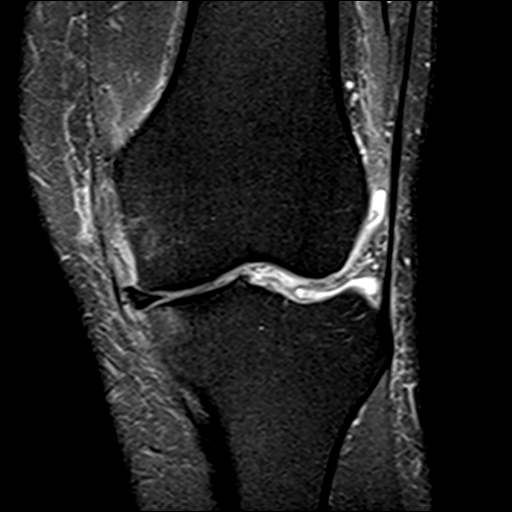
[im 19/28]
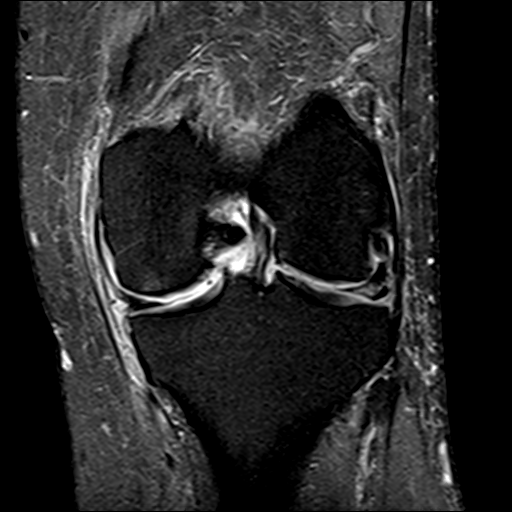
[im 23/28]
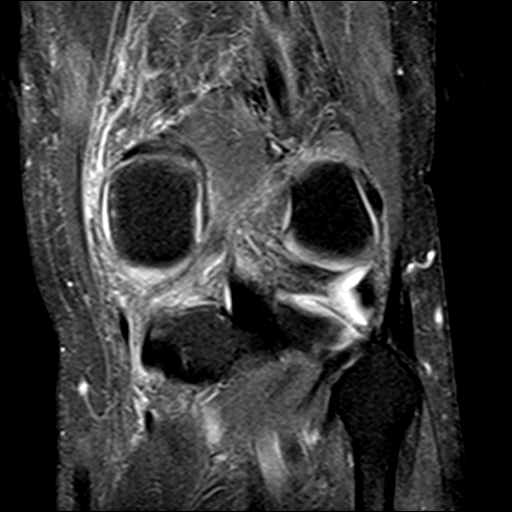
[im 28/28]
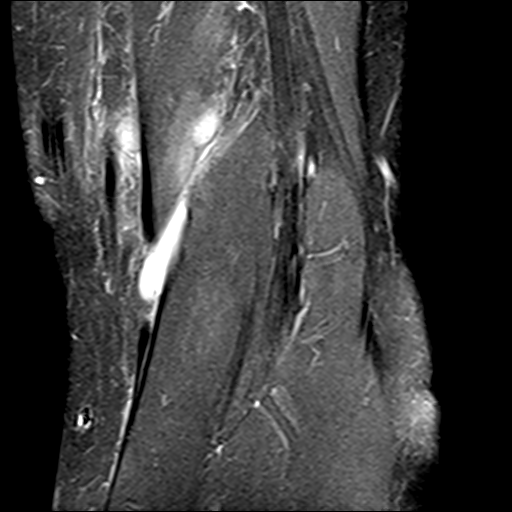

[Series 9: PD fat-sat · oblique · 2.0mm · 0.29mm/px · 3 of 11 slices shown (3 of 3)]
[im 1/11]
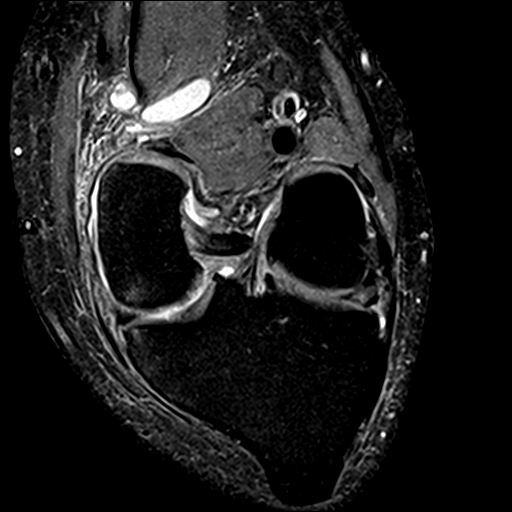
[im 6/11]
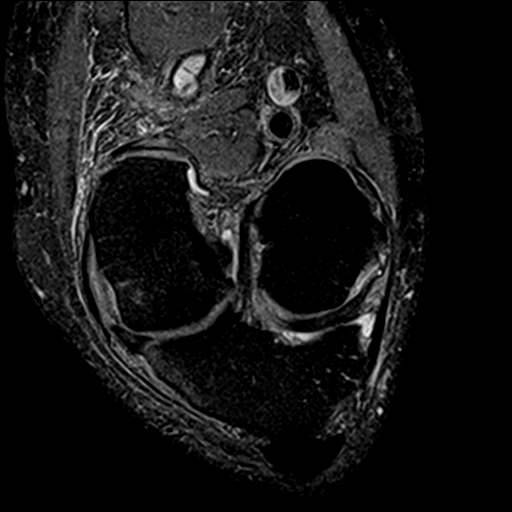
[im 11/11]
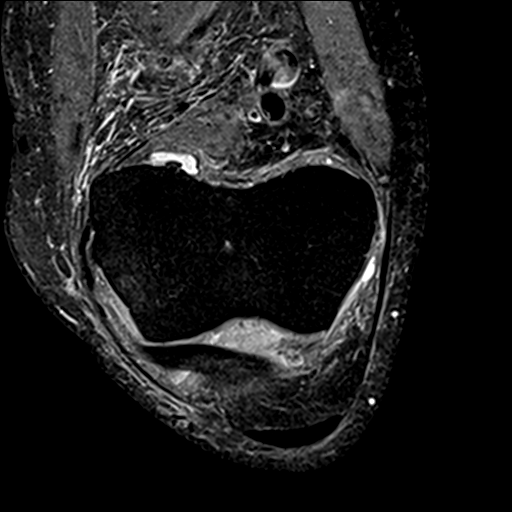

[20 of 40 positions shown; findings below may reference images not displayed]

FINDINGS: MENISCI

Medial: Complex tear of the posterior horn of the medial meniscus.

Lateral: Degeneration of the body of the lateral meniscus. Tear of
the body of the lateral meniscus extending to the superior articular
surface.

LIGAMENTS

Cruciates: ACL and PCL are intact.

Collaterals: Medial collateral ligament is intact. Lateral
collateral ligament complex is intact.

CARTILAGE

Patellofemoral: Partial-thickness cartilage loss of the lateral
patellofemoral compartment. Focal area of cartilage fissuring of
the lateral patellar facet towards the patellar apex.

Medial: High-grade partial-thickness cartilage loss of the medial
femorotibial compartment with areas of full-thickness cartilage loss
of the medial femoral condyle. Mild subchondral reactive marrow
edema of the peripheral aspect of medial femoral condyle and medial
tibial plateau.

Lateral: Mild partial-thickness cartilage loss of the lateral
femorotibial compartment.

JOINT: Large joint effusion. Mild edema in Hoffa's fat. No plical
thickening.

POPLITEAL FOSSA: Popliteus tendon is intact. Small Baker's cyst.
Mild soft tissue edema in the soft tissues superficial to the medial
femoral condyle which may be a result of a leaking or partially
ruptured Baker's cyst.

EXTENSOR MECHANISM: Intact quadriceps tendon. Intact patellar
tendon. Intact lateral patellar retinaculum. Intact medial patellar
retinaculum. Intact MPFL.

BONES: No aggressive osseous lesion. No fracture or dislocation.

Other: No fluid collection or hematoma. Muscles are normal.
IMPRESSION: 1. Complex tear of the posterior horn of the medial meniscus.
2. Degeneration of the body of the lateral meniscus. Tear of the
body of the lateral meniscus extending to the superior articular
surface.
3. Tricompartmental cartilage abnormalities as described above most
severe in the medial femorotibial compartment.
4. Large joint effusion.
5. Small Baker's cyst. Mild soft tissue edema in the soft tissues
superficial to the medial femoral condyle which may be a result of a
leaking or partially ruptured Baker's cyst.

## 2022-05-20 ENCOUNTER — Encounter: Payer: Self-pay | Admitting: Internal Medicine

## 2022-06-05 DIAGNOSIS — M1712 Unilateral primary osteoarthritis, left knee: Secondary | ICD-10-CM | POA: Diagnosis not present

## 2022-06-23 ENCOUNTER — Encounter: Payer: Self-pay | Admitting: Internal Medicine

## 2022-06-28 ENCOUNTER — Other Ambulatory Visit: Payer: Self-pay | Admitting: Family Medicine

## 2022-06-28 DIAGNOSIS — I10 Essential (primary) hypertension: Secondary | ICD-10-CM

## 2022-06-29 ENCOUNTER — Other Ambulatory Visit (INDEPENDENT_AMBULATORY_CARE_PROVIDER_SITE_OTHER): Payer: Medicare Other

## 2022-06-29 DIAGNOSIS — Z23 Encounter for immunization: Secondary | ICD-10-CM

## 2022-07-06 ENCOUNTER — Encounter: Payer: Self-pay | Admitting: Internal Medicine

## 2022-07-16 ENCOUNTER — Telehealth: Payer: Self-pay | Admitting: Family Medicine

## 2022-07-16 NOTE — Telephone Encounter (Signed)
Left message for patient to call back and schedule Medicare Annual Wellness Visit (AWV) either virtually or in office. Left  my Herbie Drape number [phone]   Last AWV 07/14/21 please schedule with Nurse Health Adviser   45 min for awv-i and in office appointments 30 min for awv-s  phone/virtual appointments

## 2022-07-27 ENCOUNTER — Ambulatory Visit: Payer: BC Managed Care – PPO | Admitting: Family Medicine

## 2022-08-10 ENCOUNTER — Encounter: Payer: Self-pay | Admitting: Family Medicine

## 2022-08-10 ENCOUNTER — Ambulatory Visit (INDEPENDENT_AMBULATORY_CARE_PROVIDER_SITE_OTHER): Payer: Medicare Other | Admitting: Family Medicine

## 2022-08-10 VITALS — BP 112/68 | HR 90 | Temp 98.6°F | Ht 67.0 in | Wt 192.4 lb

## 2022-08-10 DIAGNOSIS — I350 Nonrheumatic aortic (valve) stenosis: Secondary | ICD-10-CM | POA: Diagnosis not present

## 2022-08-10 DIAGNOSIS — I10 Essential (primary) hypertension: Secondary | ICD-10-CM

## 2022-08-10 DIAGNOSIS — Z8669 Personal history of other diseases of the nervous system and sense organs: Secondary | ICD-10-CM

## 2022-08-10 DIAGNOSIS — Z13220 Encounter for screening for lipoid disorders: Secondary | ICD-10-CM | POA: Diagnosis not present

## 2022-08-10 DIAGNOSIS — Z86010 Personal history of colonic polyps: Secondary | ICD-10-CM

## 2022-08-10 DIAGNOSIS — I498 Other specified cardiac arrhythmias: Secondary | ICD-10-CM

## 2022-08-10 DIAGNOSIS — L570 Actinic keratosis: Secondary | ICD-10-CM | POA: Diagnosis not present

## 2022-08-10 DIAGNOSIS — F338 Other recurrent depressive disorders: Secondary | ICD-10-CM

## 2022-08-10 MED ORDER — AMLODIPINE BESYLATE 10 MG PO TABS: 10.0000 mg | ORAL_TABLET | Freq: Every day | ORAL | 3 refills | Status: DC

## 2022-08-10 MED ORDER — CITALOPRAM HYDROBROMIDE 10 MG PO TABS: 10.0000 mg | ORAL_TABLET | Freq: Every day | ORAL | 3 refills | Status: DC

## 2022-08-10 MED ORDER — LOSARTAN POTASSIUM-HCTZ 100-12.5 MG PO TABS: 1.0000 | ORAL_TABLET | Freq: Every day | ORAL | 1 refills | Status: DC

## 2022-08-10 NOTE — Progress Notes (Signed)
Rodney Wade is a 66 y.o. male who presents for annual wellness visit and follow-up on chronic medical conditions.  He has had difficulty in the past with seasonal affective disorder but has been able to handle on his own.  He is under a lot more stress now dealing with his parents and with impending knee surgery and he would like to be placed on medication to help him through the winter months.  He also has a history of AS as well as AR but he is having no chest pain, shortness of breath, syncopal episodes.  He also has a history of colonic polyps and is scheduled for follow-up colonoscopy next year.  Continues on his blood pressure medications and is also taking meloxicam for his knee pain.  He is scheduled for a partial knee at the end of December.  He has not had any more migraines.  Work and home life seem to be fairly stable.   Immunizations and Health Maintenance Immunization History  Administered Date(s) Administered   Fluad Quad(high Dose 65+) 06/13/2019, 07/08/2020, 07/14/2021   Influenza Split 09/21/2011   Influenza Whole 06/30/2010   Influenza,inj,Quad PF,6+ Mos 05/27/2017, 05/31/2018   Influenza-Unspecified 05/30/2022   PFIZER Comirnaty(Gray Top)Covid-19 Tri-Sucrose Vaccine 06/29/2022   PFIZER(Purple Top)SARS-COV-2 Vaccination 10/04/2019, 10/23/2019, 06/18/2020   Pfizer Covid-19 Vaccine Bivalent Booster 11yr & up 06/03/2021   Pneumococcal Conjugate-13 06/13/2019   Pneumococcal Polysaccharide-23 07/08/2020   Tdap 04/14/2009, 07/04/2019   Zoster Recombinat (Shingrix) 03/02/2017, 05/27/2017   Zoster, Live 04/08/2015   There are no preventive care reminders to display for this patient.  Last colonoscopy: 2021 Last PSA: 07/14/21 Dentist: yearly Dr. TMarcello MooresOphtho: no eye doctor  Exercise: not exercising lately because of knee  Other doctors caring for patient include: Ortho Dr. ASynthia Innocent  Advanced Directives: Does Patient Have a Medical Advance Directive?: Yes Type of  Advance Directive: Healthcare Power of Attorney, Living will Does patient want to make changes to medical advance directive?: Yes (ED - Information included in AVS) [place]?: Yes - validated most recent copy scanned in chart (See row information)  Depression screen:  See questionnaire below.        08/10/2022    1:35 PM 01/12/2022    9:06 AM 07/14/2021    8:24 AM 07/08/2020    1:46 PM 06/13/2019    3:08 PM  Depression screen PHQ 2/9  Decreased Interest 0 0 0 0 0  Down, Depressed, Hopeless 0 0 0 1 0  PHQ - 2 Score 0 0 0 1 0    Fall Screen: See Questionaire below.      08/10/2022    1:35 PM 07/20/2022    9:07 AM 07/14/2021    8:22 AM 07/08/2020    1:44 PM 06/13/2019    3:08 PM  Fall Risk   Falls in the past year? 0 0 0 1 0  Number falls in past yr: 0 0 0 0   Injury with Fall? 0 0 0 0   Risk for fall due to : No Fall Risks  No Fall Risks No Fall Risks   Follow up Falls evaluation completed  Falls evaluation completed      ADL screen:  See questionnaire below.  Functional Status Survey: Is the patient deaf or have difficulty hearing?: No Does the patient have difficulty seeing, even when wearing glasses/contacts?: No Does the patient have difficulty concentrating, remembering, or making decisions?: No Does the patient have difficulty walking or  climbing stairs?: No Does the patient have difficulty dressing or bathing?: No Does the patient have difficulty doing errands alone such as visiting a doctor's office or shopping?: No   Review of Systems  Constitutional: -, -unexpected weight change, -anorexia, -fatigue Allergy: -sneezing, -itching, -congestion Dermatology: denies changing moles, rash, lumps ENT: -runny nose, -ear pain, -sore throat,  Cardiology:  -chest pain, -palpitations, -orthopnea, Respiratory: -cough, -shortness of breath, -dyspnea on exertion, -wheezing,  Gastroenterology: -abdominal pain, -nausea, -vomiting, -diarrhea,  -constipation, -dysphagia Hematology: -bleeding or bruising problems Musculoskeletal: -arthralgias, -myalgias, -joint swelling, -back pain, - Ophthalmology: -vision changes,  Urology: -dysuria, -difficulty urinating,  -urinary frequency, -urgency, incontinence Neurology: -, -numbness, , -memory loss, -falls, -dizziness    PHYSICAL EXAM:  BP 112/68   Pulse 90   Temp 98.6 F (37 C)   Ht '5\' 7"'$  (1.702 m)   Wt 192 lb 6.4 oz (87.3 kg)   BMI 30.13 kg/m   General Appearance: Alert, cooperative, no distress, appears stated age Head: Normocephalic, without obvious abnormality, atraumatic Eyes: PERRL, conjunctiva/corneas clear, EOM's intact, Ears: Normal TM's and external ear canals Nose: Nares normal, mucosa normal, no drainage or sinus   tenderness Throat: Lips, mucosa, and tongue normal; teeth and gums normal Neck: Supple, no lymphadenopathy, thyroid:no enlargement/tenderness/nodules; no carotid bruit or JVD Lungs: Clear to auscultation bilaterally without wheezes, rales or ronchi; respirations unlabored Heart: Irregular rhythm 3/6 SEM S1 and S2 normal, no murmur, rub or gallop Abdomen: Soft, non-tender, nondistended, normoactive bowel sounds, no masses, no hepatosplenomegaly Extremities: No clubbing, cyanosis or edema Actinic lesions noted on the scalp Skin: Skin color, texture, turgor normal, no rashes or lesions Lymph nodes: Cervical, supraclavicular, and axillary nodes normal Neurologic: CNII-XII intact, normal strength, sensation and gait; reflexes 2+ and symmetric throughout   Psych: Normal mood, affect, hygiene and grooming EKG does show multiple unifocal PVCs with trigeminy ;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 ASSESSMENT/PLAN: Aortic stenosis with trileaflet valve - Plan: ECHOCARDIOGRAM COMPLETE, EKG 12-Lead  Primary hypertension - Plan: CBC with Differential/Platelet, Comprehensive metabolic panel, losartan-hydrochlorothiazide (HYZAAR) 100-12.5 MG tablet, amLODipine  (NORVASC) 10 MG tablet  Actinic keratosis of scalp  History of migraine headaches  Hx of adenomatous colonic polyps  Seasonal affective disorder (HCC) - Plan: citalopram (CELEXA) 10 MG tablet  Essential hypertension - Plan: losartan-hydrochlorothiazide (HYZAAR) 100-12.5 MG tablet, amLODipine (NORVASC) 10 MG tablet  Screening for lipid disorders - Plan: Lipid panel  Ventricular trigeminy    Discussed the irregular rhythm with him as well as the aortic stenosis and referral to cardiology prior to his surgery I think is appropriate.  I will also place him on Celexa.  He is to call me in roughly 3 to 4 weeks to let me know how he is doing.  He does have concerns over any medicine causing an orgasm he and we will certainly keep mind.  Immunization recommendations discussed.  Colonoscopy recommendations reviewed.   Medicare Attestation I have personally reviewed: The patient's medical and social history Their use of alcohol, tobacco or illicit drugs Their current medications and supplements The patient's functional ability including ADLs,fall risks, home safety risks, cognitive, and hearing and visual impairment Diet and physical activities Evidence for depression or mood disorders  The patient's weight, height, and BMI have been recorded in the chart.  I have made referrals, counseling, and provided education to the patient based on review of the above and I have provided the patient with a written personalized care plan for preventive services.     Jill Alexanders, MD  08/10/2022     

## 2022-08-11 ENCOUNTER — Ambulatory Visit: Payer: Medicare Other | Attending: Internal Medicine | Admitting: Internal Medicine

## 2022-08-11 ENCOUNTER — Encounter: Payer: Self-pay | Admitting: Internal Medicine

## 2022-08-11 VITALS — BP 140/88 | HR 83 | Ht 67.0 in | Wt 190.8 lb

## 2022-08-11 DIAGNOSIS — M1712 Unilateral primary osteoarthritis, left knee: Secondary | ICD-10-CM | POA: Diagnosis not present

## 2022-08-11 DIAGNOSIS — Z01810 Encounter for preprocedural cardiovascular examination: Secondary | ICD-10-CM

## 2022-08-11 DIAGNOSIS — M25662 Stiffness of left knee, not elsewhere classified: Secondary | ICD-10-CM | POA: Diagnosis not present

## 2022-08-11 DIAGNOSIS — M25562 Pain in left knee: Secondary | ICD-10-CM | POA: Diagnosis not present

## 2022-08-11 DIAGNOSIS — R9431 Abnormal electrocardiogram [ECG] [EKG]: Secondary | ICD-10-CM | POA: Diagnosis not present

## 2022-08-11 LAB — CBC WITH DIFFERENTIAL/PLATELET
Basophils Absolute: 0.1 10*3/uL (ref 0.0–0.2)
Basos: 1 %
EOS (ABSOLUTE): 0.3 10*3/uL (ref 0.0–0.4)
Eos: 5 %
Hematocrit: 44.5 % (ref 37.5–51.0)
Hemoglobin: 15 g/dL (ref 13.0–17.7)
Immature Grans (Abs): 0 10*3/uL (ref 0.0–0.1)
Immature Granulocytes: 0 %
Lymphocytes Absolute: 2.5 10*3/uL (ref 0.7–3.1)
Lymphs: 43 %
MCH: 32.4 pg (ref 26.6–33.0)
MCHC: 33.7 g/dL (ref 31.5–35.7)
MCV: 96 fL (ref 79–97)
Monocytes Absolute: 0.6 10*3/uL (ref 0.1–0.9)
Monocytes: 10 %
Neutrophils Absolute: 2.4 10*3/uL (ref 1.4–7.0)
Neutrophils: 41 %
Platelets: 252 10*3/uL (ref 150–450)
RBC: 4.63 x10E6/uL (ref 4.14–5.80)
RDW: 12.9 % (ref 11.6–15.4)
WBC: 5.7 10*3/uL (ref 3.4–10.8)

## 2022-08-11 LAB — COMPREHENSIVE METABOLIC PANEL
ALT: 24 IU/L (ref 0–44)
AST: 25 IU/L (ref 0–40)
Albumin/Globulin Ratio: 2.2 (ref 1.2–2.2)
Albumin: 4.7 g/dL (ref 3.9–4.9)
Alkaline Phosphatase: 59 IU/L (ref 44–121)
BUN/Creatinine Ratio: 14 (ref 10–24)
BUN: 12 mg/dL (ref 8–27)
Bilirubin Total: 0.2 mg/dL (ref 0.0–1.2)
CO2: 25 mmol/L (ref 20–29)
Calcium: 9.7 mg/dL (ref 8.6–10.2)
Chloride: 102 mmol/L (ref 96–106)
Creatinine, Ser: 0.87 mg/dL (ref 0.76–1.27)
Globulin, Total: 2.1 g/dL (ref 1.5–4.5)
Glucose: 88 mg/dL (ref 70–99)
Potassium: 4.9 mmol/L (ref 3.5–5.2)
Sodium: 140 mmol/L (ref 134–144)
Total Protein: 6.8 g/dL (ref 6.0–8.5)
eGFR: 94 mL/min/{1.73_m2} (ref >59)

## 2022-08-11 LAB — LIPID PANEL
Chol/HDL Ratio: 2.4 ratio (ref 0.0–5.0)
Cholesterol, Total: 161 mg/dL (ref 100–199)
HDL: 67 mg/dL (ref >39)
LDL Chol Calc (NIH): 79 mg/dL (ref 0–99)
Triglycerides: 79 mg/dL (ref 0–149)
VLDL Cholesterol Cal: 15 mg/dL (ref 5–40)

## 2022-08-11 NOTE — Progress Notes (Signed)
Cardiology Office Note:    Date:  08/11/2022   ID:  Rodney Wade, DOB Jul 03, 1956, MRN [id number]  PCP:  Denita Lung, MD   [place] Providers Cardiologist:  None     Referring MD: Denita Lung, MD   No chief complaint on file. Pre-Op/PVCs/ AS  History of Present Illness:    Rodney Wade is a 66 y.o. male with a hx of mild AS, referral for new PVCs and progressive AS murmur.  Prior echo shows normal LV function. Mild AS. 04/27/2019  He is planned for a L knee replacement 09/09/2022  No limitation with stairs, no CP or SOB  No family hx of heart disease  No smoking hx  No MI hx. Notes stress test ~ 20 years ago, normal. At [facility]   Past Medical History:  Diagnosis Date   Gout    Heart murmur    HH (hiatus hernia)    History of benign essential tremor    Hypertension    Migraine headache     Past Surgical History:  Procedure Laterality Date   bone spur removal Right 2006   COLONOSCOPY     GANGLION CYST EXCISION      Current Medications: Current Meds  Medication Sig   ALPRAZolam (XANAX) 0.25 MG tablet Take 1 tablet (0.25 mg total) by mouth 2 (two) times daily as needed for anxiety.   amLODipine (NORVASC) 10 MG tablet Take 1 tablet (10 mg total) by mouth daily.   citalopram (CELEXA) 10 MG tablet Take 1 tablet (10 mg total) by mouth daily.   ibuprofen (ADVIL,MOTRIN) 200 MG tablet Take 200 mg by mouth every 6 (six) hours as needed (pain.).   losartan-hydrochlorothiazide (HYZAAR) 100-12.5 MG tablet Take 1 tablet by mouth daily.   meloxicam (MOBIC) 15 MG tablet Take 15 mg by mouth daily.   Multiple Vitamin (MULTIVITAMIN WITH MINERALS) TABS tablet Take 1 tablet by mouth daily.   Current Facility-Administered Medications for the 08/11/22 encounter (Office Visit) with Janina Mayo, MD  Medication   0.9 %  sodium chloride infusion     Allergies:   Ace inhibitors   Social History   Socioeconomic History   Marital  status: Divorced    Spouse name: Not on file   Number of children: Not on file   Years of education: Not on file   Highest education level: Doctorate  Occupational History   Not on file  Tobacco Use   Smoking status: Never   Smokeless tobacco: Never  Substance and Sexual Activity   Alcohol use: Yes    Alcohol/week: 4.0 standard drinks of alcohol    Types: 4 Glasses of wine per week    Comment: occasional   Drug use: No   Sexual activity: Yes  Other Topics Concern   Not on file  Social History Narrative   Not on file   Social Determinants of Health   Financial Resource Strain: Low Risk  (12/24/2021)   Overall Financial Resource Strain (CARDIA)    Difficulty of Paying Living Expenses: Not hard at all  Food Insecurity: No Food Insecurity (12/24/2021)   Hunger Vital Sign    Worried About Running Out of Food in the Last Year: Never true    Ran Out of Food in the Last Year: Never true  Transportation Needs: No Transportation Needs (12/24/2021)   PRAPARE - Hydrologist (Medical): No    Lack of Transportation (Non-Medical): No  Physical Activity:  Sufficiently Active (12/24/2021)   Exercise Vital Sign    Days of Exercise per Week: 4 days    Minutes of Exercise per Session: 50 min  Stress: No Stress Concern Present (12/24/2021)   [place]    Feeling of Stress : Not at all  Social Connections: Unknown (12/24/2021)   Social Connection and Isolation Panel [NHANES]    Frequency of Communication with Friends and Family: More than three times a week    Frequency of Social Gatherings with Friends and Family: Twice a week    Attends Religious Services: More than 4 times per year    Active Member of [employer] or Organizations: Not on file    Attends Archivist Meetings: Not on file    Marital Status: Divorced     Family History: The patient's family history includes Hypertension in his  father and mother. There is no history of Colon cancer, Allergic rhinitis, Angioedema, Asthma, Eczema, Colon polyps, Stomach cancer, or Esophageal cancer.  ROS:   Please see the history of present illness.     All other systems reviewed and are negative.  EKGs/Labs/Other Studies Reviewed:    The following studies were reviewed today:   EKG:  EKG is  ordered today.  The ekg ordered today demonstrates   08/11/2022- NSR, PVCs; L sided/inferior origin; posteromedial pap muscle  Recent Labs: 08/10/2022: ALT 24; BUN 12; Creatinine, Ser 0.87; Hemoglobin 15.0; Platelets 252; Potassium 4.9; Sodium 140  Recent Lipid Panel    Component Value Date/Time   CHOL 161 08/10/2022 0815   TRIG 79 08/10/2022 0815   HDL 67 08/10/2022 0815   CHOLHDL 2.4 08/10/2022 0815   CHOLHDL 2.3 05/27/2017 1347   VLDL 21 04/08/2016 0808   LDLCALC 79 08/10/2022 0815   LDLCALC 73 05/27/2017 1347     Risk Assessment/Calculations:            Physical Exam:    VS:  Vitals:   08/11/22 1124  BP: (!) 140/88  Pulse: 83  SpO2: 98%      BP (!) 140/88   Ht '5\' 7"'$  (1.702 m)   Wt 190 lb 12.8 oz (86.5 kg)   SpO2 98%   BMI 29.88 kg/m     Wt Readings from Last 3 Encounters:  08/11/22 190 lb 12.8 oz (86.5 kg)  08/10/22 192 lb 6.4 oz (87.3 kg)  04/29/22 190 lb 12.8 oz (86.5 kg)     GEN:  Well nourished, well developed in no acute distress HEENT: Normal NECK: No JVD; No carotid bruits LYMPHATICS: No lymphadenopathy CARDIAC: RRR, RUSB SEM, rubs, gallops RESPIRATORY:  Clear to auscultation without rales, wheezing or rhonchi  ABDOMEN: Soft, non-tender, non-distended MUSCULOSKELETAL:  No edema; No deformity  SKIN: Warm and dry VASC: 2+ radial NEUROLOGIC:  Alert and oriented x 3 PSYCHIATRIC:  Normal affect   ASSESSMENT:    Pre-Op/PVCs: He is asymptomatic. Can do 4 METS. However has PVCs that can be related to ischemic scarring. Will plan for TTE and lexiscan. Will provide recs for surgery after  testing  Aortic Stenosis: has RUSB SEM, AS mild on prior echo, AV mean gradient of 10. He is asymptomatic. Has normal prior LV function. Will repeat TTE per above for surveillance.   PLAN:    In order of problems listed above:  TTE SPECT Lexiscan Follow up 6 months      Shared Decision Making/Informed Consent The risks [chest pain, shortness of breath, cardiac arrhythmias, dizziness,  blood pressure fluctuations, myocardial infarction, stroke/transient ischemic attack, nausea, vomiting, allergic reaction, radiation exposure, metallic taste sensation and life-threatening complications (estimated to be 1 in 10,000)], benefits (risk stratification, diagnosing coronary artery disease, treatment guidance) and alternatives of a nuclear stress test were discussed in detail with Mr. Broz and he agrees to proceed.    Medication Adjustments/Labs and Tests Ordered: Current medicines are reviewed at length with the patient today.  Concerns regarding medicines are outlined above.  No orders of the defined types were placed in this encounter.  No orders of the defined types were placed in this encounter.   There are no Patient Instructions on file for this visit.   Signed, Janina Mayo, MD  08/11/2022 11:26 AM    [place]

## 2022-08-11 NOTE — Patient Instructions (Signed)
Medication Instructions:  No Changes In Medications at this time.  *If you need a refill on your cardiac medications before your next appointment, please call your pharmacy*  Lab Work: None Ordered At This Time.  If you have labs (blood work) drawn today and your tests are completely normal, you will receive your results only by: [place] (if you have MyChart) OR A paper copy in the mail If you have any lab test that is abnormal or we need to change your treatment, we will call you to review the results.  Testing/Procedures: Your physician has requested that you have a lexiscan myoview. For further information please visit [email or web address]. Please follow instruction sheet, as given.  Your physician has requested that you have an echocardiogram. Echocardiography is a painless test that uses sound waves to create images of your heart. It provides your doctor with information about the size and shape of your heart and how well your heart's chambers and valves are working. You may receive an ultrasound enhancing agent through an IV if needed to better visualize your heart during the echo.This procedure takes approximately one hour. There are no restrictions for this procedure. This will take place at the [address].   Follow-Up: At [facility], you and your health needs are our priority.  As part of our continuing mission to provide you with exceptional heart care, we have created designated Provider Care Teams.  These Care Teams include your primary Cardiologist (physician) and Advanced Practice Providers (APPs -  Physician Assistants and Nurse Practitioners) who all work together to provide you with the care you need, when you need it.  Your next appointment:   6 month(s)  The format for your next appointment:   In Person  Provider:   Janina Mayo, MD

## 2022-08-12 ENCOUNTER — Telehealth (HOSPITAL_COMMUNITY): Payer: Self-pay | Admitting: *Deleted

## 2022-08-12 ENCOUNTER — Ambulatory Visit (HOSPITAL_COMMUNITY)
Admission: RE | Admit: 2022-08-12 | Discharge: 2022-08-12 | Disposition: A | Discharge status: Home / Self Care | Payer: Medicare Other | Source: Ambulatory Visit | Attending: Internal Medicine | Admitting: Internal Medicine

## 2022-08-12 ENCOUNTER — Encounter: Payer: Self-pay | Admitting: Family Medicine

## 2022-08-12 DIAGNOSIS — I119 Hypertensive heart disease without heart failure: Secondary | ICD-10-CM | POA: Diagnosis not present

## 2022-08-12 DIAGNOSIS — R9431 Abnormal electrocardiogram [ECG] [EKG]: Secondary | ICD-10-CM | POA: Diagnosis not present

## 2022-08-12 DIAGNOSIS — I082 Rheumatic disorders of both aortic and tricuspid valves: Secondary | ICD-10-CM | POA: Diagnosis not present

## 2022-08-12 DIAGNOSIS — Z01810 Encounter for preprocedural cardiovascular examination: Secondary | ICD-10-CM | POA: Insufficient documentation

## 2022-08-12 LAB — ECHOCARDIOGRAM COMPLETE
AR max vel: 1.46 cm2
AV Area VTI: 1.37 cm2
AV Area mean vel: 1.32 cm2
AV Mean grad: 21.7 mmHg
AV Peak grad: 37.9 mmHg
Ao pk vel: 3.08 m/s
Area-P 1/2: 2.54 cm2
P 1/2 time: 402 msec
S' Lateral: 2.4 cm

## 2022-08-12 NOTE — Progress Notes (Signed)
*   Echocardiogram 2D Echocardiogram has been performed.  Darlina Sicilian M 08/12/2022, 2:54 PM

## 2022-08-12 NOTE — Telephone Encounter (Signed)
Patient given detailed instructions per Myocardial Perfusion Study Information Sheet for the test on 08/17/2022 at 10:45. Patient notified to arrive 15 minutes early and that it is imperative to arrive on time for appointment to keep from having the test rescheduled.  If you need to cancel or reschedule your appointment, please call the office within 24 hours of your appointment. . Patient verbalized understanding.Rodney Wade

## 2022-08-17 ENCOUNTER — Ambulatory Visit (HOSPITAL_COMMUNITY): Payer: Medicare Other | Attending: Internal Medicine

## 2022-08-17 DIAGNOSIS — R9431 Abnormal electrocardiogram [ECG] [EKG]: Secondary | ICD-10-CM | POA: Insufficient documentation

## 2022-08-17 DIAGNOSIS — Z01810 Encounter for preprocedural cardiovascular examination: Secondary | ICD-10-CM | POA: Diagnosis not present

## 2022-08-17 LAB — MYOCARDIAL PERFUSION IMAGING
LV dias vol: 99 mL (ref 62–150)
LV sys vol: 40 mL
Nuc Stress EF: 59 %
Peak HR: 100 {beats}/min
Rest HR: 81 {beats}/min
Rest Nuclear Isotope Dose: 9.8 mCi
SDS: 0
SRS: 0
SSS: 0
ST Depression (mm): 0 mm
Stress Nuclear Isotope Dose: 31.6 mCi
TID: 1.01

## 2022-08-17 MED ORDER — TECHNETIUM TC 99M TETROFOSMIN IV KIT
31.6000 | PACK | Freq: Once | INTRAVENOUS | Status: AC | PRN
  Administered 2022-08-17: 31.6 via INTRAVENOUS

## 2022-08-17 MED ORDER — TECHNETIUM TC 99M TETROFOSMIN IV KIT
9.8000 | PACK | Freq: Once | INTRAVENOUS | Status: AC | PRN
  Administered 2022-08-17: 9.8 via INTRAVENOUS

## 2022-08-17 MED ORDER — REGADENOSON 0.4 MG/5ML IV SOLN
0.4000 mg | Freq: Once | INTRAVENOUS | Status: AC
  Administered 2022-08-17: 0.4 mg via INTRAVENOUS

## 2022-08-18 ENCOUNTER — Encounter: Payer: Self-pay | Admitting: Family Medicine

## 2022-08-25 ENCOUNTER — Encounter: Payer: Self-pay | Admitting: Family Medicine

## 2022-08-31 ENCOUNTER — Encounter: Payer: Self-pay | Admitting: Family Medicine

## 2022-08-31 ENCOUNTER — Ambulatory Visit (INDEPENDENT_AMBULATORY_CARE_PROVIDER_SITE_OTHER): Payer: Medicare Other | Admitting: Family Medicine

## 2022-08-31 VITALS — BP 140/82 | HR 90 | Wt 194.6 lb

## 2022-08-31 DIAGNOSIS — F338 Other recurrent depressive disorders: Secondary | ICD-10-CM | POA: Diagnosis not present

## 2022-08-31 MED ORDER — ALPRAZOLAM 0.5 MG PO TABS: 0.5000 mg | ORAL_TABLET | Freq: Two times a day (BID) | ORAL | 0 refills | Status: AC | PRN

## 2022-08-31 NOTE — Progress Notes (Signed)
   Subjective:    Patient ID: Rodney Wade, male    DOB: 05-19-1956, 66 y.o.   MRN: [id number]  HPI He is here for a recheck.  He was given an SSRI but it interfered with sexual function and he therefore stopped the medication.  He is under a lot of stress and this is causing anxiety.  He is getting ready to have knee surgery.  His parents are requiring more and more attention and they are out of state.  He also has some evidence of seasonal affective disorder.   Review of Systems     Objective:   Physical Exam  Alert and in no distress otherwise not examined      Assessment & Plan:  Seasonal affective disorder ([place]) - Plan: ALPRAZolam (XANAX) 0.5 MG tablet Discussed the possible use of BuSpar but will hold off on that since it does take some titration.  For the time being I will give him Xanax and he is to use this on an as-needed basis.  Hopefully getting him through the holidays and helping take care of his parents as well as having the knee surgery we will allow some of the anxiety to calm down.

## 2022-09-02 ENCOUNTER — Encounter: Payer: Self-pay | Admitting: Family Medicine

## 2022-09-02 NOTE — H&P (Addendum)
ADMISSION H&P  Patient is being admitted for left unicompartmental knee arthroplasty.  Subjective:  Chief Complaint: Left knee pain.  HPI: Rodney Wade, 66 y.o. male has a history of pain and functional disability in the left knee due to arthritis and has failed non-surgical conservative treatments for greater than 12 weeks to include corticosteriod injections, viscosupplementation injections, and activity modification. Onset of symptoms was gradual, starting  several  years ago with gradually worsening course since that time. The patient noted no past surgery on the left knee.  Patient currently rates pain in the left knee at 5 out of 10 with activity. Patient has night pain, worsening of pain with activity and weight bearing, pain with passive range of motion, and crepitus. Patient has evidence of  near bone-on-bone arthritis in the medial compartment of the left knee. He does not have any lateral or patellofemoral involvement  by imaging studies. There is no active infection.  Patient Active Problem List   Diagnosis Date Noted   Actinic keratosis of scalp 07/14/2021   Unilateral primary osteoarthritis, left knee 05/14/2021   Aortic stenosis with trileaflet valve 05/31/2018   ACE inhibitor-aggravated angioedema 05/27/2017   Hx of adenomatous colonic polyps 04/08/2016   Hypertension 09/21/2011   History of migraine headaches 09/21/2011    Past Medical History:  Diagnosis Date   Gout    Heart murmur    HH (hiatus hernia)    History of benign essential tremor    Hypertension    Migraine headache     Past Surgical History:  Procedure Laterality Date   bone spur removal Right 2006   COLONOSCOPY     GANGLION CYST EXCISION      Prior to Admission medications   Medication Sig Start Date End Date Taking? Authorizing Provider  ALPRAZolam Duanne Moron) 0.5 MG tablet Take 1 tablet (0.5 mg total) by mouth 2 (two) times daily as needed for anxiety. 08/31/22   Denita Lung, MD   amLODipine (NORVASC) 10 MG tablet Take 1 tablet (10 mg total) by mouth daily. 08/10/22   Denita Lung, MD  citalopram (CELEXA) 10 MG tablet Take 1 tablet (10 mg total) by mouth daily. Patient not taking: Reported on 08/31/2022 08/10/22   Denita Lung, MD  ibuprofen (ADVIL,MOTRIN) 200 MG tablet Take 200 mg by mouth every 6 (six) hours as needed (pain.).    [provider]  losartan-hydrochlorothiazide (HYZAAR) 100-12.5 MG tablet Take 1 tablet by mouth daily. 08/10/22   Denita Lung, MD  meloxicam (MOBIC) 15 MG tablet Take 15 mg by mouth daily.    [provider]  Multiple Vitamin (MULTIVITAMIN WITH MINERALS) TABS tablet Take 1 tablet by mouth daily.    [provider]    Allergies  Allergen Reactions   Ace Inhibitors Swelling    Social History   Socioeconomic History   Marital status: Divorced    Spouse name: Not on file   Number of children: Not on file   Years of education: Not on file   Highest education level: Doctorate  Occupational History   Not on file  Tobacco Use   Smoking status: Never   Smokeless tobacco: Never  Substance and Sexual Activity   Alcohol use: Yes    Alcohol/week: 4.0 standard drinks of alcohol    Types: 4 Glasses of wine per week    Comment: occasional   Drug use: No   Sexual activity: Yes  Other Topics Concern   Not on file  Social  History Narrative   Not on file   Social Determinants of Health   Financial Resource Strain: Low Risk  (12/24/2021)   Overall Financial Resource Strain (CARDIA)    Difficulty of Paying Living Expenses: Not hard at all  Food Insecurity: No Food Insecurity (12/24/2021)   Hunger Vital Sign    Worried About Running Out of Food in the Last Year: Never true    Ran Out of Food in the Last Year: Never true  Transportation Needs: No Transportation Needs (12/24/2021)   PRAPARE - Hydrologist (Medical): No    Lack of Transportation (Non-Medical): No  Physical  Activity: Sufficiently Active (12/24/2021)   Exercise Vital Sign    Days of Exercise per Week: 4 days    Minutes of Exercise per Session: 50 min  Stress: No Stress Concern Present (12/24/2021)   [place]    Feeling of Stress : Not at all  Social Connections: Unknown (12/24/2021)   Social Connection and Isolation Panel [NHANES]    Frequency of Communication with Friends and Family: More than three times a week    Frequency of Social Gatherings with Friends and Family: Twice a week    Attends Religious Services: More than 4 times per year    Active Member of [employer] or Organizations: Not on file    Attends Archivist Meetings: Not on file    Marital Status: Divorced  Intimate Partner Violence: Not on file    Tobacco Use: Low Risk  (08/31/2022)   Patient History    Smoking Tobacco Use: Never    Smokeless Tobacco Use: Never    Passive Exposure: Not on file   Social History   Substance and Sexual Activity  Alcohol Use Yes   Alcohol/week: 4.0 standard drinks of alcohol   Types: 4 Glasses of wine per week   Comment: occasional    Family History  Problem Relation Age of Onset   Hypertension Mother    Hypertension Father    Colon cancer Neg Hx    Allergic rhinitis Neg Hx    Angioedema Neg Hx    Asthma Neg Hx    Eczema Neg Hx    Colon polyps Neg Hx    Stomach cancer Neg Hx    Esophageal cancer Neg Hx     Review of Systems  Constitutional:  Negative for chills and fever.  HENT:  Negative for congestion, sore throat and tinnitus.   Eyes:  Negative for double vision, photophobia and pain.  Respiratory:  Negative for cough, shortness of breath and wheezing.   Cardiovascular:  Negative for chest pain, palpitations and orthopnea.  Gastrointestinal:  Negative for heartburn, nausea and vomiting.  Genitourinary:  Negative for dysuria, frequency and urgency.  Musculoskeletal:  Positive for joint pain.   Neurological:  Negative for dizziness, weakness and headaches.    Objective:  Physical Exam: Well nourished and well developed.  General: Alert and oriented x3, cooperative and pleasant, no acute distress.  Head: normocephalic, atraumatic, neck supple.  Eyes: EOMI.  Musculoskeletal:  Left Knee Exam:  Trace effusion present. No swelling present.  The range of motion is: 0 to 125 degrees.  No crepitus on range of motion of the knee.  Significant medial joint line tenderness.  No lateral joint line tenderness.  The knee is stable.  Calves soft and nontender. Motor function intact in LE. Strength 5/5 LE bilaterally. Neuro: Distal pulses 2+. Sensation to light  touch intact in LE.   Imaging Review Plain radiographs demonstrate moderate degenerative joint disease of the left knee. The overall alignment is neutral. The bone quality appears to be adequate for age and reported activity level.  Assessment/Plan:  End stage arthritis, medial compartment, left knee   The patient history, physical examination, clinical judgment of the provider and imaging studies are consistent with end stage degenerative joint disease of the left knee and partial knee arthroplasty is deemed medically necessary. The treatment options including medical management, injection therapy arthroscopy and arthroplasty were discussed at length. The risks and benefits of partial knee arthroplasty were presented and reviewed. The risks due to aseptic loosening, infection, stiffness, patella tracking problems, thromboembolic complications and other imponderables were discussed. The patient acknowledged the explanation, agreed to proceed with the plan and consent was signed. Patient is being admitted for inpatient treatment for surgery, pain control, PT, OT, prophylactic antibiotics, VTE prophylaxis, progressive ambulation and ADLs and discharge planning. The patient is planning to be discharged  home .  Therapy Plans:  Outpatient therapy at [facility] Disposition: Home with daughter Planned DVT Prophylaxis: Aspirin 325 mg BID DME Needed: None PCP: Jill Alexanders, MD (clearance received) Cardiologist: Phineas Inches, MD Allergies: ACEI TXA: IV Anesthesia Concerns: None BMI: 30.2 Last HgbA1c: Not diabetic.  Pharmacy: [facility] ([address])  Other: - Prefers hydrocodone **ALL MEDS HAVE ALREADY BEEN PICKED UP BY PATIENT 12/19** - Dr. Redmond School recently found PVCs during clearance appt, referred to cardiologist (seen on 11/28 - ordered echo and stress test) - Also has known aortic stenosis, has echo performed every 2 years with PCP  - Patient was instructed on what medications to stop prior to surgery. - Follow-up visit in 2 weeks with Dr. Wynelle Link - Begin physical therapy following surgery - Pre-operative lab work as pre-surgical testing - Prescriptions will be provided in hospital at time of discharge  Theresa Duty, PA-C Orthopedic Surgery [facility] [place]

## 2022-09-18 ENCOUNTER — Other Ambulatory Visit: Payer: Self-pay | Admitting: Family Medicine

## 2022-09-18 DIAGNOSIS — I10 Essential (primary) hypertension: Secondary | ICD-10-CM

## 2022-09-22 NOTE — Patient Instructions (Signed)
DUE TO COVID-19 ONLY TWO VISITORS  (aged 69 and older)  ARE ALLOWED TO COME WITH YOU AND STAY IN THE WAITING ROOM ONLY DURING PRE OP AND PROCEDURE.   **NO VISITORS ARE ALLOWED IN THE SHORT STAY AREA OR RECOVERY ROOM!!**  IF YOU WILL BE ADMITTED INTO THE HOSPITAL YOU ARE ALLOWED ONLY FOUR SUPPORT PEOPLE DURING VISITATION HOURS ONLY (7 AM -8PM)   The support person(s) must pass our screening, gel in and out, and wear a mask at all times, including in the patient's room. Patients must also wear a mask when staff or their support person are in the room. Visitors GUEST BADGE MUST BE WORN VISIBLY  One adult visitor may remain with you overnight and MUST be in the room by 8 P.M.     Your procedure is scheduled on: 10/05/22   Report to [facility] Main Entrance    Report to admitting at : 12:15 PM   Call this number if you have problems the morning of surgery [phone]   Do not eat food :After Midnight.   After Midnight you may have the following liquids until : 11:30 AM DAY OF SURGERY  Water Black Coffee (sugar ok, NO MILK/CREAM OR CREAMERS)  Tea (sugar ok, NO MILK/CREAM OR CREAMERS) regular and decaf                             Plain Jell-O (NO RED)                                           Fruit ices (not with fruit pulp, NO RED)                                     Popsicles (NO RED)                                                                  Juice: apple, WHITE grape, WHITE cranberry Sports drinks like Gatorade (NO RED)   The day of surgery:  Drink ONE (1) Pre-Surgery Clear Ensure or G2 at: 11:30 AM the morning of surgery. Drink in one sitting. Do not sip.  This drink was given to you during your hospital  pre-op appointment visit. Nothing else to drink after completing the  Pre-Surgery Clear Ensure or G2.          If you have questions, please contact your surgeon's office.   Oral Hygiene is also important to reduce your risk of infection.                                     Remember - BRUSH YOUR TEETH THE MORNING OF SURGERY WITH YOUR REGULAR TOOTHPASTE  DENTURES WILL BE REMOVED PRIOR TO SURGERY PLEASE DO NOT APPLY "Poly grip" OR ADHESIVES!!!   Do NOT smoke after Midnight   Take these medicines the morning of surgery with A SIP OF WATER: amlodipine.Tylenol,alprazolam as needed.  You may not have any metal on your body including hair pins, jewelry, and body piercing             Do not wear lotions, powders, perfumes/cologne, or deodorant              Men may shave face and neck.   Do not bring valuables to the hospital. [place].   Contacts, glasses, or bridgework may not be worn into surgery.   Bring small overnight bag day of surgery.   DO NOT [place]. PHARMACY WILL DISPENSE MEDICATIONS LISTED ON YOUR MEDICATION LIST TO YOU DURING YOUR ADMISSION [place]!    Patients discharged on the day of surgery will not be allowed to drive home.  Someone NEEDS to stay with you for the first 24 hours after anesthesia.   Special Instructions: Bring a copy of your healthcare power of attorney and living will documents         the day of surgery if you haven't scanned them before.              Please read over the following fact sheets you were given: IF YOU HAVE QUESTIONS ABOUT YOUR PRE-OP INSTRUCTIONS PLEASE CALL [phone]    [facility] - Preparing for Surgery Before surgery, you can play an important role.  Because skin is not sterile, your skin needs to be as free of germs as possible.  You can reduce the number of germs on your skin by washing with CHG (chlorahexidine gluconate) soap before surgery.  CHG is an antiseptic cleaner which kills germs and bonds with the skin to continue killing germs even after washing. Please DO NOT use if you have an allergy to CHG or antibacterial soaps.  If your skin becomes reddened/irritated stop  using the CHG and inform your nurse when you arrive at Short Stay. Do not shave (including legs and underarms) for at least 48 hours prior to the first CHG shower.  You may shave your face/neck. Please follow these instructions carefully:  1.  Shower with CHG Soap the night before surgery and the  morning of Surgery.  2.  If you choose to wash your hair, wash your hair first as usual with your  normal  shampoo.  3.  After you shampoo, rinse your hair and body thoroughly to remove the  shampoo.                           4.  Use CHG as you would any other liquid soap.  You can apply chg directly  to the skin and wash                       Gently with a scrungie or clean washcloth.  5.  Apply the CHG Soap to your body ONLY FROM THE NECK DOWN.   Do not use on face/ open                           Wound or open sores. Avoid contact with eyes, ears mouth and genitals (private parts).                       Wash face,  Genitals (private parts) with your  normal soap.             6.  Wash thoroughly, paying special attention to the area where your surgery  will be performed.  7.  Thoroughly rinse your body with warm water from the neck down.  8.  DO NOT shower/wash with your normal soap after using and rinsing off  the CHG Soap.                9.  Pat yourself dry with a clean towel.            10.  Wear clean pajamas.            11.  Place clean sheets on your bed the night of your first shower and do not  sleep with pets. Day of Surgery : Do not apply any lotions/deodorants the morning of surgery.  Please wear clean clothes to the hospital/surgery center.  FAILURE TO FOLLOW THESE INSTRUCTIONS MAY RESULT IN THE CANCELLATION OF YOUR SURGERY PATIENT SIGNATURE_________________________________  NURSE SIGNATURE__________________________________  ________________________________________________________________________  Rodney Wade  An incentive spirometer is a tool that can help keep your lungs  clear and active. This tool measures how well you are filling your lungs with each breath. Taking long deep breaths may help reverse or decrease the chance of developing breathing (pulmonary) problems (especially infection) following: A long period of time when you are unable to move or be active. BEFORE THE PROCEDURE  If the spirometer includes an indicator to show your best effort, your nurse or respiratory therapist will set it to a desired goal. If possible, sit up straight or lean slightly forward. Try not to slouch. Hold the incentive spirometer in an upright position. INSTRUCTIONS FOR USE  Sit on the edge of your bed if possible, or sit up as far as you can in bed or on a chair. Hold the incentive spirometer in an upright position. Breathe out normally. Place the mouthpiece in your mouth and seal your lips tightly around it. Breathe in slowly and as deeply as possible, raising the piston or the ball toward the top of the column. Hold your breath for 3-5 seconds or for as long as possible. Allow the piston or ball to fall to the bottom of the column. Remove the mouthpiece from your mouth and breathe out normally. Rest for a few seconds and repeat Steps 1 through 7 at least 10 times every 1-2 hours when you are awake. Take your time and take a few normal breaths between deep breaths. The spirometer may include an indicator to show your best effort. Use the indicator as a goal to work toward during each repetition. After each set of 10 deep breaths, practice coughing to be sure your lungs are clear. If you have an incision (the cut made at the time of surgery), support your incision when coughing by placing a pillow or rolled up towels firmly against it. Once you are able to get out of bed, walk around indoors and cough well. You may stop using the incentive spirometer when instructed by your caregiver.  RISKS AND COMPLICATIONS Take your time so you do not get dizzy or light-headed. If you  are in pain, you may need to take or ask for pain medication before doing incentive spirometry. It is harder to take a deep breath if you are having pain. AFTER USE Rest and breathe slowly and easily. It can be helpful to keep track of a log of your progress. Your caregiver can  provide you with a simple table to help with this. If you are using the spirometer at home, follow these instructions: [place] IF:  You are having difficultly using the spirometer. You have trouble using the spirometer as often as instructed. Your pain medication is not giving enough relief while using the spirometer. You develop fever of 100.5 F (38.1 C) or higher. SEEK IMMEDIATE MEDICAL CARE IF:  You cough up bloody sputum that had not been present before. You develop fever of 102 F (38.9 C) or greater. You develop worsening pain at or near the incision site. MAKE SURE YOU:  Understand these instructions. Will watch your condition. Will get help right away if you are not doing well or get worse. Document Released: 01/11/2007 Document Revised: 11/23/2011 Document Reviewed: 03/14/2007 [facility] Patient Information 2014 [place], [place].   ________________________________________________________________________

## 2022-09-23 ENCOUNTER — Other Ambulatory Visit: Payer: Self-pay

## 2022-09-23 ENCOUNTER — Encounter (HOSPITAL_COMMUNITY): Payer: Self-pay

## 2022-09-23 ENCOUNTER — Encounter (HOSPITAL_COMMUNITY)
Admission: RE | Admit: 2022-09-23 | Discharge: 2022-09-23 | Disposition: A | Discharge status: Home / Self Care | Payer: Medicare Other | Source: Ambulatory Visit | Attending: Orthopedic Surgery | Admitting: Orthopedic Surgery

## 2022-09-23 VITALS — BP 141/81 | HR 99 | Temp 98.6°F | Ht 67.0 in | Wt 191.0 lb

## 2022-09-23 DIAGNOSIS — I10 Essential (primary) hypertension: Secondary | ICD-10-CM | POA: Insufficient documentation

## 2022-09-23 DIAGNOSIS — I2510 Atherosclerotic heart disease of native coronary artery without angina pectoris: Secondary | ICD-10-CM | POA: Insufficient documentation

## 2022-09-23 DIAGNOSIS — Z01812 Encounter for preprocedural laboratory examination: Secondary | ICD-10-CM | POA: Insufficient documentation

## 2022-09-23 DIAGNOSIS — M1712 Unilateral primary osteoarthritis, left knee: Secondary | ICD-10-CM | POA: Diagnosis not present

## 2022-09-23 DIAGNOSIS — Z01818 Encounter for other preprocedural examination: Secondary | ICD-10-CM

## 2022-09-23 HISTORY — DX: Cardiac arrhythmia, unspecified: I49.9

## 2022-09-23 HISTORY — DX: Anxiety disorder, unspecified: F41.9

## 2022-09-23 HISTORY — DX: Unspecified osteoarthritis, unspecified site: M19.90

## 2022-09-23 HISTORY — DX: Atherosclerotic heart disease of native coronary artery without angina pectoris: I25.10

## 2022-09-23 LAB — CBC
HCT: 42 % (ref 39.0–52.0)
Hemoglobin: 14.6 g/dL (ref 13.0–17.0)
MCH: 32.4 pg (ref 26.0–34.0)
MCHC: 34.8 g/dL (ref 30.0–36.0)
MCV: 93.3 fL (ref 80.0–100.0)
Platelets: 250 10*3/uL (ref 150–400)
RBC: 4.5 MIL/uL (ref 4.22–5.81)
RDW: 12.8 % (ref 11.5–15.5)
WBC: 8.5 10*3/uL (ref 4.0–10.5)
nRBC: 0 % (ref 0.0–0.2)

## 2022-09-23 LAB — BASIC METABOLIC PANEL
Anion gap: 10 (ref 5–15)
BUN: 13 mg/dL (ref 8–23)
CO2: 25 mmol/L (ref 22–32)
Calcium: 9.2 mg/dL (ref 8.9–10.3)
Chloride: 97 mmol/L — ABNORMAL LOW (ref 98–111)
Creatinine, Ser: 0.77 mg/dL (ref 0.61–1.24)
GFR, Estimated: >60 mL/min (ref >60)
Glucose, Bld: 112 mg/dL — ABNORMAL HIGH (ref 70–99)
Potassium: 3.8 mmol/L (ref 3.5–5.1)
Sodium: 132 mmol/L — ABNORMAL LOW (ref 135–145)

## 2022-09-23 LAB — SURGICAL PCR SCREEN
MRSA, PCR: NEGATIVE
Staphylococcus aureus: POSITIVE — AB

## 2022-09-23 NOTE — Progress Notes (Addendum)
For Short Stay: [place] appointment date:  Bowel Prep reminder:   For Anesthesia: PCP - Dr. Jill Alexanders. LOV: 08/31/22 Cardiologist - Dr. Royetta [place]: LOV: 08/11/22  Chest x-ray -  EKG - 08/10/22 Stress Test -  ECHO - 08/12/22 Cardiac Cath -  Pacemaker/ICD device last checked: Pacemaker orders received: Device Rep notified:  Spinal Cord Stimulator:  Sleep Study -  CPAP -   Fasting Blood Sugar -  Checks Blood Sugar _____ times a day Date and result of last Hgb A1c-  Last dose of GLP1 agonist-  GLP1 instructions:   Last dose of SGLT-2 inhibitors-  SGLT-2 instructions:   Blood Thinner Instructions: Aspirin Instructions: Last Dose:  Activity level: Can go up a flight of stairs and activities of daily living without stopping and without chest pain and/or shortness of breath   Able to exercise without chest pain and/or shortness of breath     Anesthesia review: Hx: HTN,Heart murmur,PVC's.  Patient denies shortness of breath, fever, cough and chest pain at PAT appointment   Patient verbalized understanding of instructions that were given to them at the PAT appointment. Patient was also instructed that they will need to review over the PAT instructions again at home before surgery.

## 2022-09-23 NOTE — Progress Notes (Signed)
PCR: + STAPH °

## 2022-09-24 NOTE — Progress Notes (Signed)
Anesthesia Chart Review   Case: [id number] Date/Time: 10/05/22 1430   Procedure: Left knee medial unicompartmental arthroplasty (Left: Knee)   Anesthesia type: Choice   Pre-op diagnosis: medial compartment osteoarthritis left knee   Location: [facility] ROOM 10 / [facility]   Surgeons: Gaynelle Arabian, MD       DISCUSSION:67 y.o. never smoker with h/o HTN, CAD, moderate AS, left knee OA scheduled for above procedure 10/05/22 with Dr. Hector Shade.   Pt last seen by cardiology 08/11/2022. Per OV note, "Pre-Op/PVCs: He is asymptomatic. Can do 4 METS. However has PVCs that can be related to ischemic scarring. Will plan for TTE and lexiscan. Will provide recs for surgery after testing "  Normal stress test 08/17/2022.   Echo 08/12/2022 with EF 60-65%, mean gradient 21.7 mmHg, valve area 1.37 cm2.  Per echo results, "his aortic valve stenosis is mildly worsened. This does not preclude him from his surgery. We will continue to monitor. Will see what his stress test shows before providing final recommendations regarding his preop assessment."  Anticipate pt can proceed with planned procedure barring acute status change.   VS: BP (!) 141/81   Pulse 99   Temp 37 C (Oral)   Ht '5\' 7"'$  (1.702 m)   Wt 86.6 kg   SpO2 97%   BMI 29.91 kg/m   PROVIDERS: Denita Lung, MD is PCP   Cardiologist - Dr. Janina Mayo  LABS: Labs reviewed: Acceptable for surgery. (all labs ordered are listed, but only abnormal results are displayed)  Labs Reviewed  SURGICAL PCR SCREEN - Abnormal; Notable for the following components:      Result Value   Staphylococcus aureus POSITIVE (*)    All other components within normal limits  BASIC METABOLIC PANEL - Abnormal; Notable for the following components:   Sodium 132 (*)    Chloride 97 (*)    Glucose, Bld 112 (*)    All other components within normal limits  CBC     IMAGES:   EKG:   CV: Echo 08/12/2022 1. Left ventricular ejection fraction, by estimation, is  60 to 65%. The  left ventricle has normal function. The left ventricle has no regional  wall motion abnormalities. There is mild concentric left ventricular  hypertrophy of the basal-septal segment.  Left ventricular diastolic parameters are consistent with Grade I  diastolic dysfunction (impaired relaxation).   2. Right ventricular systolic function is normal. The right ventricular  size is normal.   3. Left atrial size was mildly dilated.   4. The mitral valve is grossly normal. No evidence of mitral valve  regurgitation. No evidence of mitral stenosis.   5. The aortic valve is calcified. There is moderate calcification of the  aortic valve. There is mild thickening of the aortic valve. Aortic valve  regurgitation is mild. Moderate aortic valve stenosis.   Myocardial Perfusion 08/17/2022   The study is normal. The study is low risk.   No ST deviation was noted. The ECG was not diagnostic due to pharmacologic protocol.   LV perfusion is normal. There is no evidence of ischemia. There is no evidence of infarction.   Left ventricular function is normal. Nuclear stress EF: 59 %. The left ventricular ejection fraction is normal (55-65%). End diastolic cavity size is normal. End systolic cavity size is normal.   Prior study not available for comparison. Past Medical History:  Diagnosis Date   Anxiety    Arthritis    Coronary artery disease  Dysrhythmia    Gout    Heart murmur    HH (hiatus hernia)    History of benign essential tremor    Hypertension    Migraine headache     Past Surgical History:  Procedure Laterality Date   bone spur removal Right 2006   COLONOSCOPY     GANGLION CYST EXCISION      MEDICATIONS:  acetaminophen (TYLENOL) 500 MG tablet   ALPRAZolam (XANAX) 0.5 MG tablet   amLODipine (NORVASC) 10 MG tablet   citalopram (CELEXA) 10 MG tablet   ibuprofen (ADVIL,MOTRIN) 200 MG tablet   losartan-hydrochlorothiazide (HYZAAR) 100-12.5 MG tablet   meloxicam  (MOBIC) 15 MG tablet   Multiple Vitamin (MULTIVITAMIN WITH MINERALS) TABS tablet    0.9 %  sodium chloride infusion     Alishea Beaudin Ward, PA-C [facility] Pre-Surgical Testing [phone]

## 2022-09-24 NOTE — Anesthesia Preprocedure Evaluation (Addendum)
Anesthesia Evaluation  Patient identified by MRN, date of birth, ID band Patient awake    Reviewed: Allergy & Precautions, H&P , NPO status , Patient's Chart, lab work & pertinent test results  Airway Mallampati: II   Neck ROM: full    Dental   Pulmonary neg pulmonary ROS   breath sounds clear to auscultation       Cardiovascular hypertension, + Valvular Problems/Murmurs AS  Rhythm:regular Rate:Normal  Stress test (08/2022): no ischemia TTE: moderate AS with mean PG 21 mmHg, normal LV function.   Neuro/Psych  Headaches PSYCHIATRIC DISORDERS Anxiety      Neuromuscular disease    GI/Hepatic hiatal hernia,,,  Endo/Other    Renal/GU      Musculoskeletal  (+) Arthritis ,    Abdominal   Peds  Hematology   Anesthesia Other Findings   Reproductive/Obstetrics                             Anesthesia Physical Anesthesia Plan  ASA: 3  Anesthesia Plan: MAC and Spinal   Post-op Pain Management: Regional block*   Induction: Intravenous  PONV Risk Score and Plan: 1 and Ondansetron, Propofol infusion, Treatment may vary due to age or medical condition and Midazolam  Airway Management Planned: Simple Face Mask  Additional Equipment:   Intra-op Plan:   Post-operative Plan:   Informed Consent: I have reviewed the patients History and Physical, chart, labs and discussed the procedure including the risks, benefits and alternatives for the proposed anesthesia with the patient or authorized representative who has indicated his/her understanding and acceptance.     Dental advisory given  Plan Discussed with: CRNA, Anesthesiologist and Surgeon  Anesthesia Plan Comments: (See PAT note 09/23/2022)       Anesthesia Quick Evaluation

## 2022-10-05 ENCOUNTER — Encounter (HOSPITAL_COMMUNITY)
Admission: RE | Disposition: A | Discharge status: Home / Self Care | Payer: Self-pay | Source: Home / Self Care | Attending: Orthopedic Surgery

## 2022-10-05 ENCOUNTER — Encounter (HOSPITAL_COMMUNITY): Payer: Self-pay | Admitting: Orthopedic Surgery

## 2022-10-05 ENCOUNTER — Ambulatory Visit (HOSPITAL_BASED_OUTPATIENT_CLINIC_OR_DEPARTMENT_OTHER): Payer: Medicare Other | Admitting: Anesthesiology

## 2022-10-05 ENCOUNTER — Other Ambulatory Visit: Payer: Self-pay

## 2022-10-05 ENCOUNTER — Ambulatory Visit (HOSPITAL_COMMUNITY): Payer: Medicare Other | Admitting: Physician Assistant

## 2022-10-05 ENCOUNTER — Ambulatory Visit (HOSPITAL_COMMUNITY)
Admission: RE | Admit: 2022-10-05 | Discharge: 2022-10-07 | Disposition: A | Discharge status: Home / Self Care | Payer: Medicare Other | Attending: Orthopedic Surgery | Admitting: Orthopedic Surgery

## 2022-10-05 DIAGNOSIS — M25762 Osteophyte, left knee: Secondary | ICD-10-CM | POA: Insufficient documentation

## 2022-10-05 DIAGNOSIS — I350 Nonrheumatic aortic (valve) stenosis: Secondary | ICD-10-CM | POA: Diagnosis not present

## 2022-10-05 DIAGNOSIS — I10 Essential (primary) hypertension: Secondary | ICD-10-CM

## 2022-10-05 DIAGNOSIS — K449 Diaphragmatic hernia without obstruction or gangrene: Secondary | ICD-10-CM | POA: Insufficient documentation

## 2022-10-05 DIAGNOSIS — M1712 Unilateral primary osteoarthritis, left knee: Secondary | ICD-10-CM

## 2022-10-05 DIAGNOSIS — F419 Anxiety disorder, unspecified: Secondary | ICD-10-CM | POA: Diagnosis not present

## 2022-10-05 DIAGNOSIS — G709 Myoneural disorder, unspecified: Secondary | ICD-10-CM | POA: Diagnosis not present

## 2022-10-05 DIAGNOSIS — G8918 Other acute postprocedural pain: Secondary | ICD-10-CM | POA: Diagnosis not present

## 2022-10-05 HISTORY — PX: PARTIAL KNEE ARTHROPLASTY: SHX2174

## 2022-10-05 SURGERY — ARTHROPLASTY, KNEE, UNICOMPARTMENTAL
Anesthesia: Monitor Anesthesia Care | Site: Knee | Laterality: Left

## 2022-10-05 MED ORDER — METOCLOPRAMIDE HCL 5 MG/ML IJ SOLN: 5.0000 mg | Freq: Three times a day (TID) | INTRAMUSCULAR | Status: DC | PRN

## 2022-10-05 MED ORDER — ONDANSETRON HCL 4 MG/2ML IJ SOLN: 4.0000 mg | Freq: Four times a day (QID) | INTRAMUSCULAR | Status: DC | PRN

## 2022-10-05 MED ORDER — TRAMADOL HCL 50 MG PO TABS
50.0000 mg | ORAL_TABLET | Freq: Four times a day (QID) | ORAL | Status: DC | PRN
  Administered 2022-10-05 – 2022-10-06 (×2): 50 mg via ORAL
  Filled 2022-10-05 (×4): qty 1

## 2022-10-05 MED ORDER — MORPHINE SULFATE (PF) 2 MG/ML IV SOLN: 1.0000 mg | INTRAVENOUS | Status: DC | PRN

## 2022-10-05 MED ORDER — DEXAMETHASONE SODIUM PHOSPHATE 10 MG/ML IJ SOLN
10.0000 mg | Freq: Once | INTRAMUSCULAR | Status: AC
  Administered 2022-10-06: 10 mg via INTRAVENOUS
  Filled 2022-10-05: qty 1

## 2022-10-05 MED ORDER — MIDAZOLAM HCL 2 MG/2ML IJ SOLN
1.0000 mg | INTRAMUSCULAR | Status: DC
  Filled 2022-10-05: qty 2

## 2022-10-05 MED ORDER — AMLODIPINE BESYLATE 10 MG PO TABS
10.0000 mg | ORAL_TABLET | Freq: Every day | ORAL | Status: DC
  Administered 2022-10-06: 10 mg via ORAL
  Filled 2022-10-05 (×2): qty 1

## 2022-10-05 MED ORDER — 0.9 % SODIUM CHLORIDE (POUR BTL) OPTIME
TOPICAL | Status: DC | PRN
  Administered 2022-10-05: 1000 mL

## 2022-10-05 MED ORDER — PROPOFOL 500 MG/50ML IV EMUL
INTRAVENOUS | Status: DC | PRN
  Administered 2022-10-05: 75 ug/kg/min via INTRAVENOUS

## 2022-10-05 MED ORDER — HYDROCODONE-ACETAMINOPHEN 5-325 MG PO TABS
1.0000 | ORAL_TABLET | ORAL | Status: DC | PRN
  Administered 2022-10-06: 2 via ORAL
  Administered 2022-10-06: 1 via ORAL
  Administered 2022-10-06: 2 via ORAL
  Administered 2022-10-07: 1 via ORAL
  Administered 2022-10-07: 2 via ORAL
  Filled 2022-10-05 (×2): qty 2
  Filled 2022-10-05: qty 1
  Filled 2022-10-05: qty 2
  Filled 2022-10-05: qty 1
  Filled 2022-10-05: qty 2

## 2022-10-05 MED ORDER — ORAL CARE MOUTH RINSE: 15.0000 mL | Freq: Once | OROMUCOSAL | Status: DC

## 2022-10-05 MED ORDER — ONDANSETRON HCL 4 MG/2ML IJ SOLN
INTRAMUSCULAR | Status: DC | PRN
  Administered 2022-10-05: 4 mg via INTRAVENOUS

## 2022-10-05 MED ORDER — DIPHENHYDRAMINE HCL 12.5 MG/5ML PO ELIX: 12.5000 mg | ORAL_SOLUTION | ORAL | Status: DC | PRN

## 2022-10-05 MED ORDER — SODIUM CHLORIDE (PF) 0.9 % IJ SOLN
INTRAMUSCULAR | Status: AC
  Filled 2022-10-05: qty 50

## 2022-10-05 MED ORDER — SODIUM CHLORIDE (PF) 0.9 % IJ SOLN
INTRAMUSCULAR | Status: DC | PRN
  Administered 2022-10-05: 50 mL

## 2022-10-05 MED ORDER — CHLORHEXIDINE GLUCONATE 0.12 % MT SOLN: 15.0000 mL | Freq: Once | OROMUCOSAL | Status: DC

## 2022-10-05 MED ORDER — LACTATED RINGERS IV SOLN: INTRAVENOUS | Status: DC

## 2022-10-05 MED ORDER — BUPIVACAINE LIPOSOME 1.3 % IJ SUSP: 20.0000 mL | Freq: Once | INTRAMUSCULAR | Status: DC

## 2022-10-05 MED ORDER — POVIDONE-IODINE 10 % EX SWAB: 2.0000 | Freq: Once | CUTANEOUS | Status: DC

## 2022-10-05 MED ORDER — ASPIRIN 325 MG PO TBEC
325.0000 mg | DELAYED_RELEASE_TABLET | Freq: Two times a day (BID) | ORAL | Status: DC
  Administered 2022-10-05 – 2022-10-07 (×4): 325 mg via ORAL
  Filled 2022-10-05 (×4): qty 1

## 2022-10-05 MED ORDER — LOSARTAN POTASSIUM 50 MG PO TABS
100.0000 mg | ORAL_TABLET | Freq: Every day | ORAL | Status: DC
  Administered 2022-10-06: 100 mg via ORAL
  Filled 2022-10-05 (×2): qty 2

## 2022-10-05 MED ORDER — ROPIVACAINE HCL 5 MG/ML IJ SOLN
INTRAMUSCULAR | Status: DC | PRN
  Administered 2022-10-05: 25 mL via PERINEURAL

## 2022-10-05 MED ORDER — CEFAZOLIN SODIUM-DEXTROSE 2-4 GM/100ML-% IV SOLN
2.0000 g | Freq: Four times a day (QID) | INTRAVENOUS | Status: AC
  Administered 2022-10-06: 2 g via INTRAVENOUS
  Filled 2022-10-05: qty 100

## 2022-10-05 MED ORDER — METHOCARBAMOL 500 MG IVPB - SIMPLE MED
500.0000 mg | Freq: Four times a day (QID) | INTRAVENOUS | Status: DC | PRN
  Administered 2022-10-05: 500 mg via INTRAVENOUS

## 2022-10-05 MED ORDER — POLYETHYLENE GLYCOL 3350 17 G PO PACK: 17.0000 g | PACK | Freq: Every day | ORAL | Status: DC | PRN

## 2022-10-05 MED ORDER — ONDANSETRON HCL 4 MG PO TABS: 4.0000 mg | ORAL_TABLET | Freq: Four times a day (QID) | ORAL | Status: DC | PRN

## 2022-10-05 MED ORDER — TRANEXAMIC ACID-NACL 1000-0.7 MG/100ML-% IV SOLN
1000.0000 mg | INTRAVENOUS | Status: AC
  Administered 2022-10-05: 1000 mg via INTRAVENOUS
  Filled 2022-10-05: qty 100

## 2022-10-05 MED ORDER — LOSARTAN POTASSIUM-HCTZ 100-12.5 MG PO TABS: 1.0000 | ORAL_TABLET | Freq: Every day | ORAL | Status: DC

## 2022-10-05 MED ORDER — OXYCODONE HCL 5 MG PO TABS: 5.0000 mg | ORAL_TABLET | Freq: Once | ORAL | Status: DC | PRN

## 2022-10-05 MED ORDER — MENTHOL 3 MG MT LOZG: 1.0000 | LOZENGE | OROMUCOSAL | Status: DC | PRN

## 2022-10-05 MED ORDER — METHOCARBAMOL 500 MG PO TABS
500.0000 mg | ORAL_TABLET | Freq: Four times a day (QID) | ORAL | Status: DC | PRN
  Administered 2022-10-06 – 2022-10-07 (×3): 500 mg via ORAL
  Filled 2022-10-05 (×3): qty 1

## 2022-10-05 MED ORDER — FENTANYL CITRATE PF 50 MCG/ML IJ SOSY: 25.0000 ug | PREFILLED_SYRINGE | INTRAMUSCULAR | Status: DC | PRN

## 2022-10-05 MED ORDER — STERILE WATER FOR IRRIGATION IR SOLN
Status: DC | PRN
  Administered 2022-10-05: 2000 mL

## 2022-10-05 MED ORDER — DOCUSATE SODIUM 100 MG PO CAPS
100.0000 mg | ORAL_CAPSULE | Freq: Two times a day (BID) | ORAL | Status: DC
  Administered 2022-10-05 – 2022-10-07 (×4): 100 mg via ORAL
  Filled 2022-10-05 (×4): qty 1

## 2022-10-05 MED ORDER — OXYCODONE HCL 5 MG/5ML PO SOLN: 5.0000 mg | Freq: Once | ORAL | Status: DC | PRN

## 2022-10-05 MED ORDER — METHOCARBAMOL 500 MG IVPB - SIMPLE MED
INTRAVENOUS | Status: AC
  Filled 2022-10-05: qty 55

## 2022-10-05 MED ORDER — BISACODYL 10 MG RE SUPP: 10.0000 mg | Freq: Every day | RECTAL | Status: DC | PRN

## 2022-10-05 MED ORDER — CEFAZOLIN SODIUM-DEXTROSE 2-4 GM/100ML-% IV SOLN
INTRAVENOUS | Status: AC
  Administered 2022-10-05: 2 g via INTRAVENOUS
  Filled 2022-10-05: qty 100

## 2022-10-05 MED ORDER — FENTANYL CITRATE PF 50 MCG/ML IJ SOSY
50.0000 ug | PREFILLED_SYRINGE | INTRAMUSCULAR | Status: DC
  Administered 2022-10-05: 50 ug via INTRAVENOUS
  Filled 2022-10-05: qty 2

## 2022-10-05 MED ORDER — METOCLOPRAMIDE HCL 5 MG PO TABS: 5.0000 mg | ORAL_TABLET | Freq: Three times a day (TID) | ORAL | Status: DC | PRN

## 2022-10-05 MED ORDER — FLEET ENEMA 7-19 GM/118ML RE ENEM: 1.0000 | ENEMA | Freq: Once | RECTAL | Status: DC | PRN

## 2022-10-05 MED ORDER — TRAMADOL HCL 50 MG PO TABS
ORAL_TABLET | ORAL | Status: AC
  Administered 2022-10-05: 50 mg via ORAL
  Filled 2022-10-05: qty 1

## 2022-10-05 MED ORDER — PHENOL 1.4 % MT LIQD: 1.0000 | OROMUCOSAL | Status: DC | PRN

## 2022-10-05 MED ORDER — ACETAMINOPHEN 10 MG/ML IV SOLN
1000.0000 mg | Freq: Once | INTRAVENOUS | Status: AC
  Administered 2022-10-05: 1000 mg via INTRAVENOUS
  Filled 2022-10-05: qty 100

## 2022-10-05 MED ORDER — BUPIVACAINE LIPOSOME 1.3 % IJ SUSP
INTRAMUSCULAR | Status: AC
  Filled 2022-10-05: qty 20

## 2022-10-05 MED ORDER — CEFAZOLIN SODIUM-DEXTROSE 2-4 GM/100ML-% IV SOLN
2.0000 g | INTRAVENOUS | Status: AC
  Administered 2022-10-05: 2 g via INTRAVENOUS
  Filled 2022-10-05: qty 100

## 2022-10-05 MED ORDER — SODIUM CHLORIDE 0.9 % IV SOLN: INTRAVENOUS | Status: DC

## 2022-10-05 MED ORDER — DEXAMETHASONE SODIUM PHOSPHATE 10 MG/ML IJ SOLN
8.0000 mg | Freq: Once | INTRAMUSCULAR | Status: AC
  Administered 2022-10-05: 8 mg via INTRAVENOUS

## 2022-10-05 MED ORDER — HYDROCHLOROTHIAZIDE 12.5 MG PO TABS
12.5000 mg | ORAL_TABLET | Freq: Every day | ORAL | Status: DC
  Administered 2022-10-06: 12.5 mg via ORAL
  Filled 2022-10-05 (×2): qty 1

## 2022-10-05 SURGICAL SUPPLY — 63 items
BAG COUNTER SPONGE SURGICOUNT (BAG) IMPLANT
BAG DECANTER FOR FLEXI CONT (MISCELLANEOUS) IMPLANT
BAG ZIPLOCK 12X15 (MISCELLANEOUS) IMPLANT
BLADE SAW RECIPROCATING 77.5 (BLADE) ×1 IMPLANT
BLADE SAW SGTL 11.0X1.19X90.0M (BLADE) ×1 IMPLANT
BNDG ELASTIC 6X10 VLCR STRL LF (GAUZE/BANDAGES/DRESSINGS) IMPLANT
BNDG ELASTIC 6X5.8 VLCR STR LF (GAUZE/BANDAGES/DRESSINGS) ×1 IMPLANT
BOWL SMART MIX CTS (DISPOSABLE) ×1 IMPLANT
CEMENT HV SMART SET (Cement) ×1 IMPLANT
COMPONENT TIB PERSONA SZ F LT (Knees) IMPLANT
COVER SURGICAL LIGHT HANDLE (MISCELLANEOUS) ×1 IMPLANT
CUFF TOURN SGL QUICK 34 (TOURNIQUET CUFF) ×1
CUFF TRNQT CYL 34X4.125X (TOURNIQUET CUFF) ×1 IMPLANT
DRAPE INCISE IOBAN 66X45 STRL (DRAPES) ×3 IMPLANT
DRAPE U-SHAPE 47X51 STRL (DRAPES) ×1 IMPLANT
DRESSING AQUACEL AG SP 3.5X10 (GAUZE/BANDAGES/DRESSINGS) ×1 IMPLANT
DRSG ADAPTIC 3X8 NADH LF (GAUZE/BANDAGES/DRESSINGS) IMPLANT
DRSG AQUACEL AG ADV 3.5X10 (GAUZE/BANDAGES/DRESSINGS) IMPLANT
DRSG AQUACEL AG SP 3.5X10 (GAUZE/BANDAGES/DRESSINGS) ×1
DURAPREP 26ML APPLICATOR (WOUND CARE) ×1 IMPLANT
ELECT REM PT RETURN 15FT ADLT (MISCELLANEOUS) ×1 IMPLANT
GAUZE PAD ABD 8X10 STRL (GAUZE/BANDAGES/DRESSINGS) IMPLANT
GAUZE SPONGE 4X4 12PLY STRL (GAUZE/BANDAGES/DRESSINGS) IMPLANT
GLOVE BIO SURGEON STRL SZ 6.5 (GLOVE) ×2 IMPLANT
GLOVE BIO SURGEON STRL SZ7 (GLOVE) ×1 IMPLANT
GLOVE BIO SURGEON STRL SZ8 (GLOVE) ×2 IMPLANT
GLOVE BIOGEL PI IND STRL 7.0 (GLOVE) ×2 IMPLANT
GLOVE BIOGEL PI IND STRL 8 (GLOVE) ×2 IMPLANT
GOWN STRL REUS W/ TWL LRG LVL3 (GOWN DISPOSABLE) ×1 IMPLANT
GOWN STRL REUS W/ TWL XL LVL3 (GOWN DISPOSABLE) IMPLANT
GOWN STRL REUS W/TWL LRG LVL3 (GOWN DISPOSABLE) ×1
GOWN STRL REUS W/TWL XL LVL3 (GOWN DISPOSABLE)
HANDPIECE INTERPULSE COAX TIP (DISPOSABLE) ×1
HDLS TROCR DRIL PIN KNEE 75 (PIN) ×1
IMMOBILIZER KNEE 20 (SOFTGOODS) ×1
IMMOBILIZER KNEE 20 THIGH 36 (SOFTGOODS) ×1 IMPLANT
INSERTER TIP PARTIAL KNEE (MISCELLANEOUS) IMPLANT
KIT TURNOVER KIT A (KITS) IMPLANT
MANIFOLD NEPTUNE II (INSTRUMENTS) ×1 IMPLANT
NDL SAFETY ECLIP 18X1.5 (MISCELLANEOUS) IMPLANT
NS IRRIG 1000ML POUR BTL (IV SOLUTION) ×1 IMPLANT
PACK TOTAL KNEE CUSTOM (KITS) ×1 IMPLANT
PADDING CAST COTTON 6X4 STRL (CAST SUPPLIES) ×2 IMPLANT
PART SYS PERSONA SZ F 9 KNEE (Miscellaneous) ×1 IMPLANT
PERSONA PART KNEE SYS SZ F LT (Knees) ×1 IMPLANT
PIN DRILL HDLS TROCAR 75 4PK (PIN) IMPLANT
PROTECTOR NERVE ULNAR (MISCELLANEOUS) ×1 IMPLANT
PSN PK CMT FEM LM SZ 5 KNEE (Orthopedic Implant) ×1 IMPLANT
SCREW HEADED 33MM KNEE (MISCELLANEOUS) IMPLANT
SCREW HEADED 48MM KNEE (MISCELLANEOUS) IMPLANT
SET HNDPC FAN SPRY TIP SCT (DISPOSABLE) ×1 IMPLANT
SPIKE FLUID TRANSFER (MISCELLANEOUS) IMPLANT
STRIP CLOSURE SKIN 1/2X4 (GAUZE/BANDAGES/DRESSINGS) ×2 IMPLANT
SURFACE ARTC PRSNA FEM SZ 5 KN (Orthopedic Implant) IMPLANT
SUT MNCRL AB 4-0 PS2 18 (SUTURE) ×1 IMPLANT
SUT STRATAFIX 0 PDS 27 VIOLET (SUTURE) ×1
SUT VIC AB 2-0 CT1 27 (SUTURE) ×3
SUT VIC AB 2-0 CT1 TAPERPNT 27 (SUTURE) ×2 IMPLANT
SUTURE STRATFX 0 PDS 27 VIOLET (SUTURE) ×1 IMPLANT
SYR 50ML LL SCALE MARK (SYRINGE) ×1 IMPLANT
SYSTEM PRTL KNEE PERSONA SZ F9 (Miscellaneous) IMPLANT
TAPE STRIPS DRAPE STRL (GAUZE/BANDAGES/DRESSINGS) IMPLANT
WATER STERILE IRR 1000ML POUR (IV SOLUTION) ×1 IMPLANT

## 2022-10-05 NOTE — Interval H&P Note (Signed)
History and Physical Interval Note:  10/05/2022 1:22 PM  Rodney Wade  has presented today for surgery, with the diagnosis of medial compartment osteoarthritis left knee.  The various methods of treatment have been discussed with the patient and family. After consideration of risks, benefits and other options for treatment, the patient has consented to  Procedure(s): Left knee medial unicompartmental arthroplasty (Left) as a surgical intervention.  The patient's history has been reviewed, patient examined, no change in status, stable for surgery.  I have reviewed the patient's chart and labs.  Questions were answered to the patient's satisfaction.     Rodney Wade

## 2022-10-05 NOTE — Op Note (Signed)
OPERATIVE REPORT-UNICOMPARTMENTAL ARTHROPLASTY  PREOPERATIVE DIAGNOSIS: Medial compartment osteoarthritis, Left knee  POSTOPERATIVE DIAGNOSIS: Medial compartment osteoarthritis, Left knee  PROCEDURE: Left knee medial unicompartmental arthroplasty. ([facility])  SURGEON: Gaynelle Arabian, MD   ASSISTANT: Molli Barrows, PA-C  ANESTHESIA:  Adductor canal block and spinal.   ESTIMATED BLOOD LOSS: Minimal.   DRAINS: Hemovac x1.   TOURNIQUET TIME:  Total Tourniquet Time Documented: Thigh (Left) - 31 minutes Total: Thigh (Left) - 31 minutes   COMPLICATIONS: None.   CONDITION: Stable to recovery.   BRIEF CLINICAL NOTE:  Rodney Wade is a 67 y.o. male , who has  significant isolated medial compartment arthritis of the Left knee. The patient has had nonoperative management including injections of cortisone and viscous supplements. Unfortunately, the pain persists.  Radiograph showed isolated medial compartment bone-on-bone arthritis  with normal-appearing patellofemoral and lateral compartments. The patient presents now for left knee unicompartmental arthroplasty.   PROCEDURE IN DETAIL: After successful administration of  Adductor canal block and spinal anesthetic, a tourniquet was placed high on theLeft thigh and the Left lower extremity prepped and draped in usual sterile fashion. Extremity was wrapped in an Esmarch, knee flexed, and tourniquet inflated to 300 mmHg.       A midline incision was made with a 10 blade through subcutaneous tissue to the extensor mechanism. A fresh blade was used to make a  medial parapatellar arthrotomy. Soft tissue on the proximal medial  tibia subperiosteally elevated to the joint line with a knife and into  the semimembranosus bursa with a Cobb elevator. The patella was  subluxed laterally, and the knee flexed 90 degrees. The ACL was intact.  The marginal osteophytes on the medial femur and tibia were removed with  a rongeur. The medial  meniscus was also removed. The extramedullary tibial cutting guide was placed referencing Proximally at the medial aspect of the tibial tubercle and distally along the 2nd metatarsal axis and tibial crest. 6 degrees of posterior slope was dialed in and the block was pinned to remove 4 mm from the medial tibial surface.The cut is made with an oscillating saw and the cut bone removed.      The 9 mm spacer was then placed with the knee in extension with a stable fit. The distal femoral cutting guide was attached to the spacer in extension and pinned to make the distal femur cut with an oscillating saw.  The trial size 5 is placed and is most appropriate. The femoral preparation is completed with the posterior and chamfer cuts and drilling of the 2 lug holes. The trial size 5 femur is placed with excellent fit. The 9 mm spacer is placed and there is excellent balance through full range of motion.  The trial and the spacer are removed and tibia addressed.      The tibial sizer is placed and size F is most appropriate. The proximal tibia is prepared with the drill holes and keel for the size F The size F implant is placed with excellent fit. The trials are removed and cut bone surfaces prepared with pulsatile lavage. The cement is mixed and once ready for implantation The size F tibia and size 5 femur are cemented into place and all extruded cement removed. The 9 mm insert is then placed into the tibial tray  and locked into position. The knee is placed through a full range of motion with excellent stability.           I then injected the extensor  mechanism, periosteum of  the femur and subcu tissues, a total of 20 mL of Exparel mixed with 30  mL of saline. Wound was copiously irrigated with saline solution, and the arthrotomy closed over a Hemovac drain with a running #0 Stratofix  suture. The subcutaneous was closed with  interrupted 2-0 Vicryl and subcuticular running 4-0 Monocryl. The drain  was hooked  to suction. Incision cleaned and dried and Steri-Strips and  a bulky sterile dressing applied. The tourniquet was released after a  total time of 31 minutes. This was done after closing the extensor  mechanism. The wound was closed and a bulky sterile dressing was  applied. The operative limb was placed into a knee immobilizer, and the patient awakened and transported to recovery room in stable condition.       Please note that a surgical assistant was a medical necessity for this  procedure in order to perform it in a safe and expeditious manner.  Assistance was necessary for retracting vital ligaments, neurovascular  structures, as well as for proper positioning of the limb to allow for  appropriate bone cuts and appropriate placement of the prosthesis.    Rodney Plover Moira Umholtz, MD

## 2022-10-05 NOTE — Plan of Care (Signed)
  Problem: Education: Goal: Knowledge of General Education information will improve Description Including pain rating scale, medication(s)/side effects and non-pharmacologic comfort measures Outcome: Progressing   Problem: Nutrition: Goal: Adequate nutrition will be maintained Outcome: Progressing   Problem: Pain Managment: Goal: General experience of comfort will improve Outcome: Progressing   

## 2022-10-05 NOTE — Anesthesia Procedure Notes (Signed)
Anesthesia Regional Block: Adductor canal block   Pre-Anesthetic Checklist: , timeout performed,  Correct Patient, Correct Site, Correct Laterality,  Correct Procedure, Correct Position, site marked,  Risks and benefits discussed,  Surgical consent,  Pre-op evaluation,  At surgeon's request and post-op pain management  Laterality: Left  Prep: chloraprep       Needles:  Injection technique: Single-shot  Needle Type: Echogenic Needle     Needle Length: 9cm  Needle Gauge: 21     Additional Needles:   Narrative:  Start time: 10/05/2022 2:00 PM End time: 10/05/2022 2:09 PM Injection made incrementally with aspirations every 5 mL.  Performed by: Personally  Anesthesiologist: Albertha Ghee, MD  Additional Notes: Pt tolerated the procedure well.

## 2022-10-05 NOTE — Transfer of Care (Signed)
Immediate Anesthesia Transfer of Care Note  Patient: Rodney Wade  Procedure(s) Performed: Left knee medial unicompartmental arthroplasty (Left: Knee)  Patient Location: PACU  Anesthesia Type:Spinal  Level of Consciousness: awake, alert , and oriented  Airway & Oxygen Therapy: Patient Spontanous Breathing and Patient connected to face mask oxygen  Post-op Assessment: Report given to RN and Post -op Vital signs reviewed and stable  Post vital signs: Reviewed and stable  Last Vitals:  Vitals Value Taken Time  BP 107/72 10/05/22 1603  Temp    Pulse 77 10/05/22 1604  Resp 14 10/05/22 1604  SpO2 100 % 10/05/22 1604  Vitals shown include unvalidated device data.  Last Pain:  Vitals:   10/05/22 1224  TempSrc: Oral  PainSc:       Patients Stated Pain Goal: 5 (69/43/70 0525)  Complications: No notable events documented.

## 2022-10-05 NOTE — Care Plan (Signed)
Ortho Bundle Case Management Note  Patient Details  Name: Rodney Wade MRN: [id number] Date of Birth: 1955-11-17  L [facility] on 10-05-22 DCP:  home with dtr and significant other DME:  No needs, has a RW PT:  [facility] on 10-08-22                   DME Arranged:  N/A DME Agency:  NA  HH Arranged:  NA HH Agency:  NA  Additional Comments: Please contact me with any questions of if this plan should need to change.  Marianne Sofia, RN,CCM [facility]  [phone] 10/05/2022, 5:30 PM

## 2022-10-05 NOTE — Discharge Instructions (Addendum)
Rodney Arabian, MD Total Joint Specialist [facility] [place] [address] [place], [place] [address] [phone]  PARTIAL KNEE REPLACEMENT POSTOPERATIVE DIRECTIONS    Knee Rehabilitation, Guidelines Following Surgery  Results after knee surgery are often greatly improved when you follow the exercise, range of motion and muscle strengthening exercises prescribed by your doctor. Safety measures are also important to protect the knee from further injury. If any of these exercises cause you to have increased pain or swelling in your knee joint, decrease the amount until you are comfortable again and slowly increase them. If you have problems or questions, call your caregiver or physical therapist for advice.   BLOOD CLOT PREVENTION Take a 325 mg Aspirin two times a day for three weeks following surgery. Then resume your regular '81mg'$  aspirin. You may resume your vitamins/supplements upon discharge from the hospital. Do not take any NSAIDs (Advil, Aleve, Ibuprofen, Meloxicam, etc.) until you have discontinued the 325 mg Aspirin.   HOME CARE INSTRUCTIONS  Remove items at home which could result in a fall. This includes throw rugs or furniture in walking pathways.  ICE to the affected knee as much as tolerated. Icing helps control swelling. If the swelling is well controlled you will be more comfortable and rehab easier. Continue to use ice on the knee for pain and swelling from surgery. You may notice swelling that will progress down to the foot and ankle. This is normal after surgery. Elevate the leg when you are not up walking on it.    Continue to use the breathing machine which will help keep your temperature down. It is common for your temperature to cycle up and down following surgery, especially at night when you are not up moving around and exerting yourself. The breathing machine keeps your lungs expanded and your temperature down. Do not place pillow under the  operative knee, focus on keeping the knee straight while resting  DIET You may resume your previous home diet once you are discharged from the hospital.  DRESSING / WOUND CARE / SHOWERING Keep your bulky bandage on for 2 days. On the third post-operative day you may remove the Ace bandage and gauze. There is a waterproof adhesive bandage on your skin which will stay in place until your first follow-up appointment. Once you remove this you will not need to place another bandage You may begin showering 3 days following surgery, but do not submerge the incision under water.  ACTIVITY For the first 5 days, the key is rest and control of pain and swelling Do your home exercises twice a day starting on post-operative day 3. On the days you go to physical therapy, just do the home exercises once that day. You should rest, ice and elevate the leg for 50 minutes out of every hour. Get up and walk/stretch for 10 minutes per hour. After 5 days you can increase your activity slowly as tolerated. Walk with your walker as instructed. Use the walker until you are comfortable transitioning to a cane. Walk with the cane in the opposite hand of the operative leg. You may discontinue the cane once you are comfortable and walking steadily. Avoid periods of inactivity such as sitting longer than an hour when not asleep. This helps prevent blood clots.  You may discontinue the knee immobilizer once you are able to perform a straight leg raise while lying down. You may resume a sexual relationship in one month or when given the OK by your doctor.  You may return to work once you are cleared by your doctor.  Do not drive a car for 6 weeks or until released by your surgeon.  Do not drive while taking narcotics.  TED HOSE STOCKINGS Wear the elastic stockings on both legs for three weeks following surgery during the day. You may remove them at night for sleeping.  WEIGHT BEARING Weight bearing as tolerated with assist  device (walker, cane, etc) as directed, use it as long as suggested by your surgeon or therapist, typically at least 4-6 weeks.  POSTOPERATIVE CONSTIPATION PROTOCOL Constipation - defined medically as fewer than three stools per week and severe constipation as less than one stool per week.  One of the most common issues patients have following surgery is constipation.  Even if you have a regular bowel pattern at home, your normal regimen is likely to be disrupted due to multiple reasons following surgery.  Combination of anesthesia, postoperative narcotics, change in appetite and fluid intake all can affect your bowels.  In order to avoid complications following surgery, here are some recommendations in order to help you during your recovery period.  Colace (docusate) - Pick up an over-the-counter form of Colace or another stool softener and take twice a day as long as you are requiring postoperative pain medications.  Take with a full glass of water daily.  If you experience loose stools or diarrhea, hold the colace until you stool forms back up. If your symptoms do not get better within 1 week or if they get worse, check with your doctor. Dulcolax (bisacodyl) - Pick up over-the-counter and take as directed by the product packaging as needed to assist with the movement of your bowels.  Take with a full glass of water.  Use this product as needed if not relieved by Colace only.  MiraLax (polyethylene glycol) - Pick up over-the-counter to have on hand. MiraLax is a solution that will increase the amount of water in your bowels to assist with bowel movements.  Take as directed and can mix with a glass of water, juice, soda, coffee, or tea. Take if you go more than two days without a movement. Do not use MiraLax more than once per day. Call your doctor if you are still constipated or irregular after using this medication for 7 days in a row.  If you continue to have problems with postoperative constipation,  please contact the office for further assistance and recommendations.  If you experience "the worst abdominal pain ever" or develop nausea or vomiting, please contact the office immediatly for further recommendations for treatment.  ITCHING If you experience itching with your medications, try taking only a single pain pill, or even half a pain pill at a time.  You can also use Benadryl over the counter for itching or also to help with sleep.   MEDICATIONS See your medication summary on the "After Visit Summary" that the nursing staff will review with you prior to discharge.  You may have some home medications which will be placed on hold until you complete the course of blood thinner medication.  It is important for you to complete the blood thinner medication as prescribed by your surgeon.  Continue your approved medications as instructed at time of discharge.  PRECAUTIONS If you experience chest pain or shortness of breath - call 911 immediately for transfer to the hospital emergency department.  If you develop a fever greater that 101 F, purulent drainage from wound, increased redness or drainage from  wound, foul odor from the wound/dressing, or calf pain - CONTACT YOUR SURGEON.                                                   FOLLOW-UP APPOINTMENTS Make sure you keep all of your appointments after your operation with your surgeon and caregivers. You should call the office at the above phone number and make an appointment for approximately two weeks after the date of your surgery or on the date instructed by your surgeon outlined in the "After Visit Summary".  RANGE OF MOTION AND STRENGTHENING EXERCISES  Rehabilitation of the knee is important following a knee injury or an operation. After just a few days of immobilization, the muscles of the thigh which control the knee become weakened and shrink (atrophy). Knee exercises are designed to build up the tone and strength of the thigh muscles and to  improve knee motion. Often times heat used for twenty to thirty minutes before working out will loosen up your tissues and help with improving the range of motion but do not use heat for the first two weeks following surgery. These exercises can be done on a training (exercise) mat, on the floor, on a table or on a bed. Use what ever works the best and is most comfortable for you Knee exercises include:  Leg Lifts - While your knee is still immobilized in a splint or cast, you can do straight leg raises. Lift the leg to 60 degrees, hold for 3 sec, and slowly lower the leg. Repeat 10-20 times 2-3 times daily. Perform this exercise against resistance later as your knee gets better.  Quad and Hamstring Sets - Tighten up the muscle on the front of the thigh (Quad) and hold for 5-10 sec. Repeat this 10-20 times hourly. Hamstring sets are done by pushing the foot backward against an object and holding for 5-10 sec. Repeat as with quad sets.  Leg Slides: Lying on your back, slowly slide your foot toward your buttocks, bending your knee up off the floor (only go as far as is comfortable). Then slowly slide your foot back down until your leg is flat on the floor again. Angel Wings: Lying on your back spread your legs to the side as far apart as you can without causing discomfort.  A rehabilitation program following serious knee injuries can speed recovery and prevent re-injury in the future due to weakened muscles. Contact your doctor or a physical therapist for more information on knee rehabilitation.   POST-OPERATIVE OPIOID TAPER INSTRUCTIONS: It is important to wean off of your opioid medication as soon as possible. If you do not need pain medication after your surgery it is ok to stop day one. Opioids include: Codeine, Hydrocodone(Norco, Vicodin), Oxycodone(Percocet, oxycontin) and hydromorphone amongst others.  Long term and even short term use of opiods can cause: Increased pain  response Dependence Constipation Depression Respiratory depression And more.  Withdrawal symptoms can include Flu like symptoms Nausea, vomiting And more Techniques to manage these symptoms Hydrate well Eat regular healthy meals Stay active Use relaxation techniques(deep breathing, meditating, yoga) Do Not substitute Alcohol to help with tapering If you have been on opioids for less than two weeks and do not have pain than it is ok to stop all together.  Plan to wean off of opioids This plan should start  within one week post op of your joint replacement. Maintain the same interval or time between taking each dose and first decrease the dose.  Cut the total daily intake of opioids by one tablet each day Next start to increase the time between doses. The last dose that should be eliminated is the evening dose.   IF YOU ARE TRANSFERRED TO A SKILLED REHAB FACILITY If the patient is transferred to a skilled rehab facility following release from the hospital, a list of the current medications will be sent to the facility for the patient to continue.  When discharged from the skilled rehab facility, please have the facility set up the patient's [place] prior to being released. Also, the skilled facility will be responsible for providing the patient with their medications at time of release from the facility to include their pain medication, the muscle relaxants, and their blood thinner medication. If the patient is still at the rehab facility at time of the two week follow up appointment, the skilled rehab facility will also need to assist the patient in arranging follow up appointment in our office and any transportation needs.  MAKE SURE YOU:  Understand these instructions.  Get help right away if you are not doing well or get worse.   DENTAL ANTIBIOTICS:  In most cases prophylactic antibiotics for Dental procdeures after total joint surgery are not  necessary.  Exceptions are as follows:  1. History of prior total joint infection  2. Severely immunocompromised (Organ Transplant, cancer chemotherapy, Rheumatoid biologic meds such as [place])  3. Poorly controlled diabetes (A1C &gt; 8.0, blood glucose over 200)  If you have one of these conditions, contact your surgeon for an antibiotic prescription, prior to your dental procedure.    Pick up stool softner and laxative for home use following surgery while on pain medications. Do not submerge incision under water. Please use good hand washing techniques while changing dressing each day. May shower starting three days after surgery. Please use a clean towel to pat the incision dry following showers. Continue to use ice for pain and swelling after surgery. Do not use any lotions or creams on the incision until instructed by your surgeon.

## 2022-10-06 ENCOUNTER — Encounter (HOSPITAL_COMMUNITY): Payer: Self-pay | Admitting: Orthopedic Surgery

## 2022-10-06 DIAGNOSIS — M25762 Osteophyte, left knee: Secondary | ICD-10-CM | POA: Diagnosis not present

## 2022-10-06 DIAGNOSIS — K449 Diaphragmatic hernia without obstruction or gangrene: Secondary | ICD-10-CM | POA: Diagnosis not present

## 2022-10-06 DIAGNOSIS — G709 Myoneural disorder, unspecified: Secondary | ICD-10-CM | POA: Diagnosis not present

## 2022-10-06 DIAGNOSIS — I10 Essential (primary) hypertension: Secondary | ICD-10-CM | POA: Diagnosis not present

## 2022-10-06 DIAGNOSIS — M1712 Unilateral primary osteoarthritis, left knee: Secondary | ICD-10-CM | POA: Diagnosis not present

## 2022-10-06 LAB — CBC
HCT: 41.6 % (ref 39.0–52.0)
Hemoglobin: 14.4 g/dL (ref 13.0–17.0)
MCH: 32.8 pg (ref 26.0–34.0)
MCHC: 34.6 g/dL (ref 30.0–36.0)
MCV: 94.8 fL (ref 80.0–100.0)
Platelets: 220 10*3/uL (ref 150–400)
RBC: 4.39 MIL/uL (ref 4.22–5.81)
RDW: 12.5 % (ref 11.5–15.5)
WBC: 10.1 10*3/uL (ref 4.0–10.5)
nRBC: 0 % (ref 0.0–0.2)

## 2022-10-06 LAB — BASIC METABOLIC PANEL
Anion gap: 8 (ref 5–15)
BUN: 13 mg/dL (ref 8–23)
CO2: 26 mmol/L (ref 22–32)
Calcium: 8.8 mg/dL — ABNORMAL LOW (ref 8.9–10.3)
Chloride: 101 mmol/L (ref 98–111)
Creatinine, Ser: 0.79 mg/dL (ref 0.61–1.24)
GFR, Estimated: >60 mL/min (ref >60)
Glucose, Bld: 144 mg/dL — ABNORMAL HIGH (ref 70–99)
Potassium: 4.1 mmol/L (ref 3.5–5.1)
Sodium: 135 mmol/L (ref 135–145)

## 2022-10-06 MED ORDER — SODIUM CHLORIDE 0.9 % IV BOLUS
500.0000 mL | Freq: Once | INTRAVENOUS | Status: AC
  Administered 2022-10-06: 500 mL via INTRAVENOUS

## 2022-10-06 NOTE — Progress Notes (Signed)
   Subjective: 1 Day Post-Op Procedure(s) (LRB): Left knee medial unicompartmental arthroplasty (Left) Patient seen in rounds by Dr. Wynelle Link. Patient is well, and has had no acute complaints or problems. Denies SOB or chest pain. Denies calf pain. Foley cath removed this AM. Patient reports pain as mild. We will start physical therapy today.  Objective: Vital signs in last 24 hours: Temp:  [97.6 F (36.4 C)-98.8 F (37.1 C)] 98.3 F (36.8 C) (01/23 0523) Pulse Rate:  [65-103] 102 (01/23 0523) Resp:  [14-19] 16 (01/23 0523) BP: (104-152)/(69-101) 139/91 (01/23 0523) SpO2:  [95 %-100 %] 97 % (01/23 0523) Weight:  [86.6 kg] 86.6 kg (01/22 2055)  Intake/Output from previous day:  Intake/Output Summary (Last 24 hours) at 10/06/2022 0727 Last data filed at 10/06/2022 0600 Gross per 24 hour  Intake 2700 ml  Output 3075 ml  Net -375 ml     Intake/Output this shift: No intake/output data recorded.  Labs: Recent Labs    10/06/22 0345  HGB 14.4   Recent Labs    10/06/22 0345  WBC 10.1  RBC 4.39  HCT 41.6  PLT 220   Recent Labs    10/06/22 0345  NA 135  K 4.1  CL 101  CO2 26  BUN 13  CREATININE 0.79  GLUCOSE 144*  CALCIUM 8.8*   No results for input(s): "LABPT", "INR" in the last 72 hours.  Exam: General - Patient is Alert and Oriented Extremity - Neurologically intact Neurovascular intact Sensation intact distally Dorsiflexion/Plantar flexion intact Dressing - dressing C/D/I Motor Function - intact, moving foot and toes well on exam.  Past Medical History:  Diagnosis Date   Anxiety    Arthritis    Coronary artery disease    Dysrhythmia    Gout    Heart murmur    HH (hiatus hernia)    History of benign essential tremor    Hypertension    Migraine headache     Assessment/Plan: 1 Day Post-Op Procedure(s) (LRB): Left knee medial unicompartmental arthroplasty (Left) Principal Problem:   Unilateral primary osteoarthritis, left knee Active  Problems:   Primary osteoarthritis of left knee  Estimated body mass index is 29.91 kg/m as calculated from the following:   Height as of this encounter: '5\' 7"'$  (1.702 m).   Weight as of this encounter: 86.6 kg. Advance diet Up with therapy D/C IV fluids  Patient's anticipated LOS is less than 2 midnights, meeting these requirements: - Lives within 1 hour of care - Has a competent adult at home to recover with post-op - NO history of  - Chronic pain requiring opiods  - Diabetes  - Coronary Artery Disease  - Heart failure  - Heart attack  - Stroke  - DVT/VTE  - Respiratory Failure/COPD  - Renal failure  - Anemia  - Advanced Liver disease  DVT Prophylaxis - Aspirin Weight bearing as tolerated.  Start physical therapy. Expected discharge home today pending progress with physical therapy and if meeting patient goals. Scheduled for OPPT at [facility]. Follow-up in clinic in 2 weeks.  Per patient, all medications have been picked up in December when surgery originally scheduled at [facility]. Does not need any other medications sent in.  Rainey Pines, PA-C Orthopedic Surgery [phone] 10/06/2022, 7:27 AM

## 2022-10-06 NOTE — Progress Notes (Signed)
Notified by PT of syncopal episode, reck of BP approx 15 min after this episode = 127/75, P 96, skin w/d, pt states he feels fine in reclincer. Will cont to monitor

## 2022-10-06 NOTE — Progress Notes (Addendum)
Physical Therapy Treatment Patient Details Name: Rodney Wade MRN: [id number] DOB: 1956/05/15 Today's Date: 10/06/2022   History of Present Illness Pt is a 67 y/o M admitted on 10/05/22 for scheduled left knee medial unicompartmental arthroplasty. PMH: aortic stenosis with trileaflet valve,  HTN, gout, anxiety, CAD, hiatus hernia, benign essential tremor    PT Comments    Pt seen for PT tx with pt agreeable, family present in room throughout session. Pt reports need to use restroom & denies adverse symptoms so pt ambulated in room/bathroom with RW & min assist. Pt is able to complete STS from recliner & low toilet with CGA<>supervision. After returning to recliner, it's then pt reports feeling lightheaded so pt assisted to sitting when pt reports vision going blurry. BLE elevated & BP checked; nurse called to room & notified & pt left in care of nurse.  BP in LUE sitting in recliner with BLE elevated: 64/48 mmHg MAP 55, HR 72 bpm BLE elevated in recliner, head reclined: 101/61 mmHg MAP 74    Recommendations for follow up therapy are one component of a multi-disciplinary discharge planning process, led by the attending physician.  Recommendations may be updated based on patient status, additional functional criteria and insurance authorization.  Follow Up Recommendations  Follow physician's recommendations for discharge plan and follow up therapies     Assistance Recommended at Discharge Intermittent Supervision/Assistance  Patient can return home with the following A little help with walking and/or transfers;A little help with bathing/dressing/bathroom;Assistance with cooking/housework;Assist for transportation;Help with stairs or ramp for entrance   Equipment Recommendations  None recommended by PT    Recommendations for Other Services       Precautions / Restrictions Precautions Precautions: Fall;Knee Restrictions Weight Bearing Restrictions: Yes LLE Weight Bearing:  Weight bearing as tolerated     Mobility  Bed Mobility Overal bed mobility: Needs Assistance Bed Mobility: Supine to Sit, Sit to Supine     Supine to sit: Supervision, HOB elevated (use of bed rails, extra time PRN) Sit to supine: Max assist (2/2 pt not feeling well)   General bed mobility comments: Not tested, pt received & left sitting in recliner    Transfers Overall transfer level: Needs assistance Equipment used: Rolling walker (2 wheels) Transfers: Sit to/from Stand Sit to Stand: Min guard, Supervision   Step pivot transfers: Min assist       General transfer comment: CGA STS from recliner, supervision STS from low toilet with use of grab bar    Ambulation/Gait Ambulation/Gait assistance: Min assist Gait Distance (Feet): 12 Feet (+ 12 ft) Assistive device: Rolling walker (2 wheels) Gait Pattern/deviations: Decreased step length - right, Decreased step length - left, Decreased stride length, Decreased dorsiflexion - right, Decreased dorsiflexion - left Gait velocity: decreased         Stairs             Wheelchair Mobility    Modified Rankin (Stroke Patients Only)       Balance Overall balance assessment: Needs assistance Sitting-balance support: Feet supported, Bilateral upper extremity supported Sitting balance-Leahy Scale: Good Sitting balance - Comments: supervision sitting on toilet without assistance or LOB for peri hygiene   Standing balance support: During functional activity, Bilateral upper extremity supported, Reliant on assistive device for balance Standing balance-Leahy Scale: Fair                              Cognition Arousal/Alertness: Awake/alert Behavior During Therapy:  WFL for tasks assessed/performed Overall Cognitive Status: Within Functional Limits for tasks assessed                                          Exercises Total Joint Exercises Gluteal Sets: AROM, Left, 5 reps, Supine Heel  Slides: AAROM, Supine, Strengthening, Left, 10 reps Hip ABduction/ADduction: AAROM, Supine, Strengthening, Left, 10 reps Straight Leg Raises: AAROM, Supine, Strengthening, Left, 10 reps Long Arc Quad: AROM, Strengthening, Left, 10 reps, Seated    General Comments General comments (skin integrity, edema, etc.): Pt reports continent small BM & void on toilet.      Pertinent Vitals/Pain Pain Assessment Pain Assessment: Faces Faces Pain Scale: Hurts even more Pain Location: L knee Pain Descriptors / Indicators: Discomfort, Grimacing, Guarding Pain Intervention(s): Monitored during session, Repositioned    Home Living Family/patient expects to be discharged to:: Private residence Living Arrangements: Alone Available Help at Discharge: Family;Friend(s);Available PRN/intermittently Type of Home: House (condo) Home Access: Stairs to enter Entrance Stairs-Rails: None Entrance Stairs-Number of Steps: 1 step without rails at curb + 1 step without rails to enter home Alternate Level Stairs-Number of Steps: flight with R rail to access bedroom/full bathroom Home Layout: Two level;1/2 bath on main level;Bed/bath upstairs Home Equipment: Conservation officer, nature (2 wheels) (has DME borrowed from friend)      Prior Function            PT Goals (current goals can now be found in the care plan section) Acute Rehab PT Goals Patient Stated Goal: go home PT Goal Formulation: With patient Time For Goal Achievement: 10/21/22 Potential to Achieve Goals: Good Progress towards PT goals: Progressing toward goals    Frequency    7X/week      PT Plan Current plan remains appropriate    Co-evaluation              AM-PAC PT "6 Clicks" Mobility   Outcome Measure  Help needed turning from your back to your side while in a flat bed without using bedrails?: None Help needed moving from lying on your back to sitting on the side of a flat bed without using bedrails?: A Little Help needed moving to  and from a bed to a chair (including a wheelchair)?: A Little Help needed standing up from a chair using your arms (e.g., wheelchair or bedside chair)?: A Little Help needed to walk in hospital room?: A Little Help needed climbing 3-5 steps with a railing? : A Lot 6 Click Score: 18    End of Session   Activity Tolerance: Treatment limited secondary to medical complications (Comment) Patient left: in chair;with call bell/phone within reach;with nursing/sitter in room;with family/visitor present Nurse Communication: Mobility status (BP, events of session) PT Visit Diagnosis: Pain;Difficulty in walking, not elsewhere classified (R26.2);Muscle weakness (generalized) (M62.81);Other abnormalities of gait and mobility (R26.89) Pain - Right/Left: Left Pain - part of body: Knee     Time: 1345-1402 PT Time Calculation (min) (ACUTE ONLY): 17 min  Charges:  $Therapeutic Activity: 8-22 mins                     Lavone Nian, PT, DPT 10/06/22, 2:08 PM   Waunita Schooner 10/06/2022, 2:07 PM

## 2022-10-06 NOTE — Progress Notes (Signed)
Message left for PA on call notifying of Pt's most recent BP 132/101 and pulse 101. Pt states that he isn't really having any pain, mostly discomfort.

## 2022-10-06 NOTE — TOC Transition Note (Signed)
Transition of Care [facility]) - CM/SW Discharge Note   Patient Details  Name: Rodney Wade MRN: [id number] Date of Birth: [birth year]  Transition of [facility]) CM/SW Contact:  Lennart Pall, LCSW Phone Number: 10/06/2022, 11:35 AM   Clinical Narrative:    Met briefly with pt and confirming he has needed DME at home.  OPPT already arranged with [facility].  No TOC needs.   Final next level of care: OP Rehab Barriers to Discharge: No Barriers Identified   Patient Goals and CMS Choice      Discharge Placement                         Discharge Plan and Services Additional resources added to the After Visit Summary for                  DME Arranged: N/A DME Agency: NA       HH Arranged: NA HH Agency: NA        Social Determinants of Health (SDOH) Interventions SDOH Screenings   Food Insecurity: No Food Insecurity (10/06/2022)  Housing: Low Risk  (10/06/2022)  Transportation Needs: No Transportation Needs (10/06/2022)  Utilities: Not At Risk (10/06/2022)  Alcohol Screen: Low Risk  (12/24/2021)  Depression (PHQ2-9): Low Risk  (08/10/2022)  Financial Resource Strain: Low Risk  (12/24/2021)  Physical Activity: Sufficiently Active (12/24/2021)  Social Connections: Unknown (12/24/2021)  Stress: No Stress Concern Present (12/24/2021)  Tobacco Use: Low Risk  (10/05/2022)     Readmission Risk Interventions     No data to display

## 2022-10-06 NOTE — Plan of Care (Signed)
  Problem: Pain Management: Goal: Pain level will decrease with appropriate interventions Outcome: Progressing   

## 2022-10-06 NOTE — Progress Notes (Addendum)
PA notified of pt's 2nd syncopal episode w PT (walked to BR, had small BM, then felt briefly dizzy and diaphoretic). BP ck per PT = 64/48. BP reck by myself = 127/75, P 88, pt cool, dry.  Also notified that pt voided unknown amt in commode, bladder scan = 868 ml. In/out cath for 1100 cc's yellow urine. Will cont to monitor

## 2022-10-06 NOTE — Evaluation (Signed)
Physical Therapy Evaluation Patient Details Name: Rodney Wade MRN: [id number] DOB: 1955/12/11 Today's Date: 10/06/2022  History of Present Illness  Pt is a 67 y/o M admitted on 10/05/22 for scheduled left knee medial unicompartmental arthroplasty. PMH: aortic stenosis with trileaflet valve,  HTN, gout, anxiety, CAD, hiatus hernia, benign essential tremor  Clinical Impression  Pt seen for PT evaluation with pt agreeable. Prior to admission pt was independent without AD, driving & working. On this date, pt is able to complete supine>sit with supervision with hospital bed features & extra time PRN. Pt reports lightheadedness sitting EOB that doesn't worsen until after standing ~1 minute. Pt assisted supine 2/2 pt becoming diaphoretic with some delayed responses but still responding to PT. After resting supine, pt reports feeling better & BP monitored with positional changes. Pt assisted bed>recliner via step pivot with RW & min assist. Notified nurse of pt's BP & events of session. Will plan to see pt in PM to progress strengthening & ROM, transfers, & gait as able.  BP checked in LUE: Return to supine: 110/74 mmHg (MAP 85) Sitting EOB: 94/67 mmHg (MAP 77) Sitting in recliner after transferring: 91/61 mmHg (MAP 71) Rechecked sitting in recliner: 92/68 mmHg (MAP 77)   Recommendations for follow up therapy are one component of a multi-disciplinary discharge planning process, led by the attending physician.  Recommendations may be updated based on patient status, additional functional criteria and insurance authorization.  Follow Up Recommendations Follow physician's recommendations for discharge plan and follow up therapies      Assistance Recommended at Discharge Intermittent Supervision/Assistance  Patient can return home with the following  A little help with walking and/or transfers;A little help with bathing/dressing/bathroom;Assistance with cooking/housework;Assist for  transportation;Help with stairs or ramp for entrance    Equipment Recommendations None recommended by PT (pt already has RW)  Recommendations for Other Services       Functional Status Assessment Patient has had a recent decline in their functional status and demonstrates the ability to make significant improvements in function in a reasonable and predictable amount of time.     Precautions / Restrictions Precautions Precautions: Fall;Knee Restrictions Weight Bearing Restrictions: Yes LLE Weight Bearing: Weight bearing as tolerated      Mobility  Bed Mobility Overal bed mobility: Needs Assistance Bed Mobility: Supine to Sit, Sit to Supine     Supine to sit: Supervision, HOB elevated (use of bed rails, extra time PRN) Sit to supine: Max assist (2/2 pt not feeling well)        Transfers Overall transfer level: Needs assistance Equipment used: Rolling walker (2 wheels) Transfers: Sit to/from Stand, Bed to chair/wheelchair/BSC Sit to Stand: Min assist   Step pivot transfers: Min assist       General transfer comment: Cuing re: safe hand placement during STS.    Ambulation/Gait                  Stairs            Wheelchair Mobility    Modified Rankin (Stroke Patients Only)       Balance Overall balance assessment: Needs assistance Sitting-balance support: Feet supported, Bilateral upper extremity supported Sitting balance-Leahy Scale: Good Sitting balance - Comments: supervision static sitting   Standing balance support: Bilateral upper extremity supported, During functional activity, Reliant on assistive device for balance Standing balance-Leahy Scale: Fair  Pertinent Vitals/Pain Pain Assessment Pain Assessment: Faces Faces Pain Scale: Hurts little more Pain Location: L knee Pain Descriptors / Indicators: Discomfort Pain Intervention(s): Monitored during session, Repositioned (ice applied at end  of session)    Home Living Family/patient expects to be discharged to:: Private residence Living Arrangements: Alone Available Help at Discharge: Family;Friend(s);Available PRN/intermittently Type of Home: House (condo) Home Access: Stairs to enter Entrance Stairs-Rails: None Entrance Stairs-Number of Steps: 1 step without rails at curb + 1 step without rails to enter home Alternate Level Stairs-Number of Steps: flight with R rail to access bedroom/full bathroom Home Layout: Two level;1/2 bath on main level;Bed/bath upstairs Home Equipment: Conservation officer, nature (2 wheels) (has DME borrowed from friend)      Prior Function Prior Level of Function : Independent/Modified Independent;Driving;Working/employed             Mobility Comments: Works as Control and instrumentation engineer.       Journalist, newspaper        Extremity/Trunk Assessment   Upper Extremity Assessment Upper Extremity Assessment: Overall WFL for tasks assessed    Lower Extremity Assessment Lower Extremity Assessment: LLE deficits/detail LLE Deficits / Details: 2/5 knee extension in sitting       Communication   Communication: No difficulties  Cognition Arousal/Alertness: Awake/alert Behavior During Therapy: WFL for tasks assessed/performed Overall Cognitive Status: Within Functional Limits for tasks assessed                                          General Comments      Exercises Total Joint Exercises Gluteal Sets: AROM, Left, 5 reps, Supine Heel Slides: AAROM, Supine, Strengthening, Left, 10 reps Hip ABduction/ADduction: AAROM, Supine, Strengthening, Left, 10 reps Straight Leg Raises: AAROM, Supine, Strengthening, Left, 10 reps Long Arc Quad: AROM, Strengthening, Left, 10 reps, Seated   Assessment/Plan    PT Assessment Patient needs continued PT services  PT Problem List Decreased strength;Pain;Decreased activity tolerance;Decreased balance;Decreased mobility;Decreased safety  awareness;Decreased knowledge of use of DME;Decreased range of motion       PT Treatment Interventions DME instruction;Balance training;Gait training;Therapeutic exercise;Stair training;Neuromuscular re-education;Functional mobility training;Therapeutic activities;Patient/family education;Modalities    PT Goals (Current goals can be found in the Care Plan section)  Acute Rehab PT Goals Patient Stated Goal: go home PT Goal Formulation: With patient Time For Goal Achievement: 10/21/22 Potential to Achieve Goals: Good    Frequency 7X/week     Co-evaluation               AM-PAC PT "6 Clicks" Mobility  Outcome Measure Help needed turning from your back to your side while in a flat bed without using bedrails?: None Help needed moving from lying on your back to sitting on the side of a flat bed without using bedrails?: A Little Help needed moving to and from a bed to a chair (including a wheelchair)?: A Little Help needed standing up from a chair using your arms (e.g., wheelchair or bedside chair)?: A Little Help needed to walk in hospital room?: A Little Help needed climbing 3-5 steps with a railing? : A Lot 6 Click Score: 18    End of Session   Activity Tolerance: Treatment limited secondary to medical complications (Comment) Patient left: in chair;with chair alarm set;with family/visitor present Nurse Communication: Mobility status (BP & events during session) PT Visit Diagnosis: Pain;Difficulty in walking, not elsewhere classified (R26.2);Muscle weakness (generalized) (M62.81);Other  abnormalities of gait and mobility (R26.89) Pain - Right/Left: Left Pain - part of body: Knee    Time: 4835-0757 PT Time Calculation (min) (ACUTE ONLY): 30 min   Charges:   PT Evaluation $PT Eval Low Complexity: 1 Low PT Treatments $Therapeutic Activity: 8-22 mins        Lavone Nian, PT, DPT 10/06/22, 12:23 PM   Waunita Schooner 10/06/2022, 12:18 PM

## 2022-10-07 ENCOUNTER — Encounter (HOSPITAL_COMMUNITY): Payer: Self-pay | Admitting: Orthopedic Surgery

## 2022-10-07 DIAGNOSIS — M1712 Unilateral primary osteoarthritis, left knee: Secondary | ICD-10-CM | POA: Diagnosis not present

## 2022-10-07 DIAGNOSIS — I10 Essential (primary) hypertension: Secondary | ICD-10-CM | POA: Diagnosis not present

## 2022-10-07 DIAGNOSIS — K449 Diaphragmatic hernia without obstruction or gangrene: Secondary | ICD-10-CM | POA: Diagnosis not present

## 2022-10-07 DIAGNOSIS — M25762 Osteophyte, left knee: Secondary | ICD-10-CM | POA: Diagnosis not present

## 2022-10-07 DIAGNOSIS — G709 Myoneural disorder, unspecified: Secondary | ICD-10-CM | POA: Diagnosis not present

## 2022-10-07 LAB — CBC
HCT: 37.2 % — ABNORMAL LOW (ref 39.0–52.0)
Hemoglobin: 13 g/dL (ref 13.0–17.0)
MCH: 32.9 pg (ref 26.0–34.0)
MCHC: 34.9 g/dL (ref 30.0–36.0)
MCV: 94.2 fL (ref 80.0–100.0)
Platelets: 204 10*3/uL (ref 150–400)
RBC: 3.95 MIL/uL — ABNORMAL LOW (ref 4.22–5.81)
RDW: 12.7 % (ref 11.5–15.5)
WBC: 9.6 10*3/uL (ref 4.0–10.5)
nRBC: 0 % (ref 0.0–0.2)

## 2022-10-07 NOTE — Progress Notes (Signed)
Physical Therapy Treatment Patient Details Name: Rodney Wade MRN: [id number] DOB: 02-25-1956 Today's Date: 10/07/2022   History of Present Illness Pt is a 67 y/o M admitted on 10/05/22 for scheduled left knee medial unicompartmental arthroplasty. PMH: aortic stenosis with trileaflet valve,  HTN, gout, anxiety, CAD, hiatus hernia, benign essential tremor    PT Comments    Pt seen for PT tx with pt agreeable. Pt reports feeling better today, no c/o dizziness/lightheadedness during session, although pt does note LLE pain & slight nausea. Pt is able to complete bed mobility with hospital bed features & supervision, STS with CGA<>supervision. Pt increases ambulation distances to max of ~100 ft with RW & CGA<>supervision with gait pattern as noted below. PT educated pt on importance of mobilizing at least every hour, especially upon d/c & discussed pt staying on 1st floor until able to negotiate flight of stairs to access upstairs & pt in agreement. Will f/u with pt for 2nd PT session today to focus on single step negotiation.  BP in LUE sitting in recliner after ambulating 20 ft: 131/79 mmHg MAP 96, HR 83 bpm Sitting in recliner after ambulating 100 ft: 100/74 mmHg MAP 82, HR 101 bpm    Recommendations for follow up therapy are one component of a multi-disciplinary discharge planning process, led by the attending physician.  Recommendations may be updated based on patient status, additional functional criteria and insurance authorization.  Follow Up Recommendations  Follow physician's recommendations for discharge plan and follow up therapies     Assistance Recommended at Discharge Intermittent Supervision/Assistance  Patient can return home with the following A little help with walking and/or transfers;A little help with bathing/dressing/bathroom;Assistance with cooking/housework;Assist for transportation;Help with stairs or ramp for entrance   Equipment Recommendations  None  recommended by PT    Recommendations for Other Services       Precautions / Restrictions Precautions Precautions: Fall;Knee Restrictions Weight Bearing Restrictions: Yes LLE Weight Bearing: Weight bearing as tolerated     Mobility  Bed Mobility Overal bed mobility: Needs Assistance Bed Mobility: Supine to Sit     Supine to sit: Supervision, HOB elevated     General bed mobility comments: extra time to move LLE to EOB, use of bed rails, HOB elevated    Transfers Overall transfer level: Needs assistance Equipment used: Rolling walker (2 wheels) Transfers: Sit to/from Stand Sit to Stand: Min guard, Supervision           General transfer comment: STS from EOB & recliner    Ambulation/Gait Ambulation/Gait assistance: Min guard, Supervision Gait Distance (Feet):  (20 + 100 ft) Assistive device: Rolling walker (2 wheels) Gait Pattern/deviations: Step-to pattern, Decreased step length - left, Decreased dorsiflexion - right, Decreased step length - right, Decreased dorsiflexion - left, Decreased stride length, Decreased weight shift to left           Stairs             Wheelchair Mobility    Modified Rankin (Stroke Patients Only)       Balance Overall balance assessment: Needs assistance Sitting-balance support: Feet supported, Bilateral upper extremity supported Sitting balance-Leahy Scale: Good     Standing balance support: During functional activity, No upper extremity supported Standing balance-Leahy Scale: Fair                              Cognition Arousal/Alertness: Awake/alert Behavior During Therapy: WFL for tasks assessed/performed Overall Cognitive Status:  Within Functional Limits for tasks assessed                                          Exercises Total Joint Exercises Long Arc Quad: AROM, Strengthening, Left, 10 reps, Seated Knee Flexion: AAROM, Left, 10 reps, Seated (use of towel under L foot to  reduce friction)    General Comments        Pertinent Vitals/Pain Pain Assessment Pain Assessment: Faces Faces Pain Scale: Hurts little more Pain Location: L knee Pain Descriptors / Indicators: Discomfort, Grimacing, Guarding Pain Intervention(s): Monitored during session, Repositioned (ice pack applied at end of session)    Home Living                          Prior Function            PT Goals (current goals can now be found in the care plan section) Acute Rehab PT Goals Patient Stated Goal: go home Time For Goal Achievement: 10/21/22 Potential to Achieve Goals: Good Progress towards PT goals: Progressing toward goals    Frequency    7X/week      PT Plan Current plan remains appropriate    Co-evaluation              AM-PAC PT "6 Clicks" Mobility   Outcome Measure  Help needed turning from your back to your side while in a flat bed without using bedrails?: None Help needed moving from lying on your back to sitting on the side of a flat bed without using bedrails?: A Little Help needed moving to and from a bed to a chair (including a wheelchair)?: A Little Help needed standing up from a chair using your arms (e.g., wheelchair or bedside chair)?: A Little Help needed to walk in hospital room?: A Little Help needed climbing 3-5 steps with a railing? : A Little 6 Click Score: 19    End of Session Equipment Utilized During Treatment: Gait belt Activity Tolerance: Patient tolerated treatment well Patient left: in chair;with call bell/phone within reach;with nursing/sitter in room Nurse Communication: Mobility status PT Visit Diagnosis: Pain;Difficulty in walking, not elsewhere classified (R26.2);Muscle weakness (generalized) (M62.81);Other abnormalities of gait and mobility (R26.89) Pain - Right/Left: Left Pain - part of body: Knee     Time: 0812-0843 PT Time Calculation (min) (ACUTE ONLY): 31 min  Charges:  $Therapeutic Activity: 23-37  mins                     Lavone Nian, PT, DPT 10/07/22, 8:50 AM   Waunita Schooner 10/07/2022, 8:48 AM

## 2022-10-07 NOTE — Progress Notes (Signed)
   Subjective: 2 Days Post-Op Procedure(s) (LRB): Left knee medial unicompartmental arthroplasty (Left) Patient reports pain as mild.   Patient seen in rounds for Dr. Wynelle Link. Had difficulty with PT yesterday due to lightheadedness. Ambulated 12'. Patient is feeling better this morning. Was able to get to edge of bed and transfer to bedside commode without dizziness/lightheadedness but has not ambulated. Reports improved urinary output. States he slept very well last night and is looking forward to going home. Plan is to go Home after hospital stay.  Objective: Vital signs in last 24 hours: Temp:  [97.6 F (36.4 C)-99.3 F (37.4 C)] 98.7 F (37.1 C) (01/24 0537) Pulse Rate:  [88-108] 90 (01/24 0537) Resp:  [15-20] 15 (01/24 0537) BP: (92-139)/(73-85) 134/79 (01/24 0537) SpO2:  [97 %-100 %] 98 % (01/24 0537)  Intake/Output from previous day:  Intake/Output Summary (Last 24 hours) at 10/07/2022 0814 Last data filed at 10/07/2022 0200 Gross per 24 hour  Intake 2377.5 ml  Output 1800 ml  Net 577.5 ml    Intake/Output this shift: No intake/output data recorded.  Labs: Recent Labs    10/06/22 0345 10/07/22 0329  HGB 14.4 13.0   Recent Labs    10/06/22 0345 10/07/22 0329  WBC 10.1 9.6  RBC 4.39 3.95*  HCT 41.6 37.2*  PLT 220 204   Recent Labs    10/06/22 0345  NA 135  K 4.1  CL 101  CO2 26  BUN 13  CREATININE 0.79  GLUCOSE 144*  CALCIUM 8.8*   No results for input(s): "LABPT", "INR" in the last 72 hours.  Exam: General - Patient is Alert and Oriented Extremity - Neurovascular intact Dorsiflexion/Plantar flexion intact Dressing/Incision - dry Motor Function - intact, moving foot and toes well on exam.   Past Medical History:  Diagnosis Date   Anxiety    Arthritis    Coronary artery disease    Dysrhythmia    Gout    Heart murmur    HH (hiatus hernia)    History of benign essential tremor    Hypertension    Migraine headache      Assessment/Plan: 2 Days Post-Op Procedure(s) (LRB): Left knee medial unicompartmental arthroplasty (Left) Principal Problem:   Unilateral primary osteoarthritis, left knee Active Problems:   Primary osteoarthritis of left knee  Estimated body mass index is 29.91 kg/m as calculated from the following:   Height as of this encounter: '5\' 7"'$  (1.702 m).   Weight as of this encounter: 86.6 kg. Up with therapy today. Continue to monitor BP. Plan for discharge home later this afternoon if BP remains stable and he meets PT goals.  DVT Prophylaxis - Aspirin Weight-bearing as tolerated  Shearon Balo, PA-C Orthopedic Surgery 10/07/2022, 8:14 AM

## 2022-10-07 NOTE — Addendum Note (Signed)
Addendum  created 10/07/22 1357 by Albertha Ghee, MD   Child order released for a procedure order, Clinical Note Signed, Intraprocedure Blocks edited, SmartForm saved

## 2022-10-07 NOTE — Anesthesia Procedure Notes (Signed)
Spinal  Patient location during procedure: OR Start time: 10/05/2022 2:20 PM End time: 10/05/2022 2:23 PM Reason for block: surgical anesthesia Staffing Performed: anesthesiologist  Anesthesiologist: Albertha Ghee, MD Performed by: Albertha Ghee, MD Authorized by: Albertha Ghee, MD   Preanesthetic Checklist Completed: patient identified, IV checked, risks and benefits discussed, surgical consent, monitors and equipment checked, pre-op evaluation and timeout performed Spinal Block Patient position: sitting Prep: DuraPrep Patient monitoring: cardiac monitor, continuous pulse ox and blood pressure Approach: midline Location: L3-4 Injection technique: single-shot Needle Needle type: Pencan  Needle gauge: 24 G Needle length: 9 cm Assessment Sensory level: T10 Events: CSF return Additional Notes Functioning IV was confirmed and monitors were applied. Sterile prep and drape, including hand hygiene and sterile gloves were used. The patient was positioned and the spine was prepped. The skin was anesthetized with lidocaine.  Free flow of clear CSF was obtained prior to injecting local anesthetic into the CSF.  The spinal needle aspirated freely following injection.  The needle was carefully withdrawn.  The patient tolerated the procedure well.

## 2022-10-07 NOTE — Progress Notes (Signed)
Physical Therapy Treatment Patient Details Name: Rodney Wade MRN: [id number] DOB: 1955-12-03 Today's Date: 10/07/2022   History of Present Illness Pt is a 67 y/o M admitted on 10/05/22 for scheduled left knee medial unicompartmental arthroplasty. PMH: aortic stenosis with trileaflet valve,  HTN, gout, anxiety, CAD, hiatus hernia, benign essential tremor    PT Comments    Pt seen for PT tx with pt agreeable, family present in room. Pt is able to complete bed mobility with use of hospital bed features, supine>sit with supervision but min assist for sit>supine to elevate LLE onto bed. Pt is able to ambulate into hallway with RW & supervision & negotiates single step with RW & CGA with PT providing educational cuing/instruction for technique. Pt voices comfort with gait & stair negotiation. Once supine in bed at end of session pt endorses feeling "clammy" but otherwise okay. BP in LUE 132/94 mmHg MAP 104, HR 52 bpm. Pt eager for d/c this afternoon.      Recommendations for follow up therapy are one component of a multi-disciplinary discharge planning process, led by the attending physician.  Recommendations may be updated based on patient status, additional functional criteria and insurance authorization.  Follow Up Recommendations  Follow physician's recommendations for discharge plan and follow up therapies     Assistance Recommended at Discharge Intermittent Supervision/Assistance  Patient can return home with the following A little help with walking and/or transfers;A little help with bathing/dressing/bathroom;Assistance with cooking/housework;Assist for transportation;Help with stairs or ramp for entrance   Equipment Recommendations  None recommended by PT    Recommendations for Other Services       Precautions / Restrictions Precautions Precautions: Fall;Knee Restrictions Weight Bearing Restrictions: Yes LLE Weight Bearing: Weight bearing as tolerated     Mobility   Bed Mobility Overal bed mobility: Needs Assistance Bed Mobility: Supine to Sit, Sit to Supine     Supine to sit: HOB elevated, Supervision Sit to supine: Min assist, HOB elevated (to elevate LLE onto bed)   General bed mobility comments: extra time to move LLE to EOB, use of bed rails, HOB elevated    Transfers Overall transfer level: Needs assistance Equipment used: Rolling walker (2 wheels) Transfers: Sit to/from Stand Sit to Stand: Supervision           General transfer comment: STS from EOB with supervision    Ambulation/Gait Ambulation/Gait assistance: Supervision Gait Distance (Feet): 100 Feet Assistive device: Rolling walker (2 wheels) Gait Pattern/deviations: Step-to pattern, Decreased step length - left, Decreased dorsiflexion - right, Decreased step length - right, Decreased dorsiflexion - left, Decreased stride length, Decreased weight shift to left Gait velocity: decreased     General Gait Details: Pt demonstrates decreased weight bearing through LLE during LLE stance phase.   Stairs Stairs: Yes Stairs assistance: Min guard Stair Management: No rails, Backwards, Forwards, With walker Number of Stairs: 1 General stair comments: Pt negotiates 1 step x 2 times, backwards to ascend, forwards to descend with RW & CGA. Pt demonstrates good ability to manage RW & recall sequencing.   Wheelchair Mobility    Modified Rankin (Stroke Patients Only)       Balance Overall balance assessment: Needs assistance Sitting-balance support: Feet supported, Bilateral upper extremity supported Sitting balance-Leahy Scale: Good Sitting balance - Comments: supervision sitting on toilet without assistance or LOB for peri hygiene   Standing balance support: During functional activity, No upper extremity supported Standing balance-Leahy Scale: Fair  Cognition Arousal/Alertness: Awake/alert Behavior During Therapy: WFL for tasks  assessed/performed Overall Cognitive Status: Within Functional Limits for tasks assessed                                          Exercises Total Joint Exercises Long Arc Quad: AROM, Strengthening, Left, 10 reps, Seated Knee Flexion: AAROM, Left, 10 reps, Seated (use of towel under L foot to reduce friction)    General Comments General comments (skin integrity, edema, etc.): Discussed car transfer, STS off low toilet, side stepping within base of AD if doorways are narrow, using walker bag/basket to transport items, use of gait belt PRN, use of polar care and/or ice packs (20 minutes on/20 minutes off), importance of promoting L knee extension, & removing/securing throw rugs to reduce tripping hazards.      Pertinent Vitals/Pain Pain Assessment Pain Assessment: Faces Faces Pain Scale: Hurts a little bit Pain Location: L knee Pain Descriptors / Indicators: Discomfort, Grimacing, Guarding Pain Intervention(s): Monitored during session (ice applied at end of session)    Home Living                          Prior Function            PT Goals (current goals can now be found in the care plan section) Acute Rehab PT Goals Patient Stated Goal: go home PT Goal Formulation: With patient Time For Goal Achievement: 10/21/22 Potential to Achieve Goals: Good Progress towards PT goals: Progressing toward goals    Frequency    7X/week      PT Plan Current plan remains appropriate    Co-evaluation              AM-PAC PT "6 Clicks" Mobility   Outcome Measure  Help needed turning from your back to your side while in a flat bed without using bedrails?: None Help needed moving from lying on your back to sitting on the side of a flat bed without using bedrails?: A Little Help needed moving to and from a bed to a chair (including a wheelchair)?: A Little Help needed standing up from a chair using your arms (e.g., wheelchair or bedside chair)?: A  Little Help needed to walk in hospital room?: A Little Help needed climbing 3-5 steps with a railing? : A Little 6 Click Score: 19    End of Session Equipment Utilized During Treatment: Gait belt Activity Tolerance: Patient tolerated treatment well Patient left: in bed;with call bell/phone within reach;with family/visitor present;with SCD's reapplied (ice applied to L knee) Nurse Communication: Mobility status PT Visit Diagnosis: Pain;Difficulty in walking, not elsewhere classified (R26.2);Muscle weakness (generalized) (M62.81);Other abnormalities of gait and mobility (R26.89) Pain - Right/Left: Left Pain - part of body: Knee     Time: 2440-1027 PT Time Calculation (min) (ACUTE ONLY): 23 min  Charges:  $Gait Training: 8-22 mins $Therapeutic Activity: 8-22 mins                     Lavone Nian, PT, DPT 10/07/22, 11:33 AM   Waunita Schooner 10/07/2022, 11:32 AM

## 2022-10-07 NOTE — Progress Notes (Signed)
The patient is alert and oriented and has been seen by his physician. The orders for discharge were written. IV has been removed. Went over discharge instructions with patient and family. He is being discharged via wheelchair with all of his belongings.  

## 2022-10-07 NOTE — Anesthesia Postprocedure Evaluation (Signed)
Anesthesia Post Note  Patient: Rodney Wade  Procedure(s) Performed: Left knee medial unicompartmental arthroplasty (Left: Knee)     Patient location during evaluation: PACU Anesthesia Type: General Level of consciousness: awake and alert Pain management: pain level controlled Vital Signs Assessment: post-procedure vital signs reviewed and stable Respiratory status: spontaneous breathing, nonlabored ventilation, respiratory function stable and patient connected to nasal cannula oxygen Cardiovascular status: blood pressure returned to baseline and stable Postop Assessment: no apparent nausea or vomiting Anesthetic complications: no   No notable events documented.  Last Vitals:  Vitals:   10/07/22 0537 10/07/22 0846  BP: 134/79 126/75  Pulse: 90 89  Resp: 15   Temp: 37.1 C   SpO2: 98%     Last Pain:  Vitals:   10/07/22 0850  TempSrc:   PainSc: Petal

## 2022-10-07 NOTE — Plan of Care (Signed)
  Problem: Education: Goal: Knowledge of General Education information will improve Description: Including pain rating scale, medication(s)/side effects and non-pharmacologic comfort measures Outcome: Progressing   Problem: Health Behavior/Discharge Planning: Goal: Ability to manage health-related needs will improve Outcome: Progressing   Problem: Activity: Goal: Risk for activity intolerance will decrease Outcome: Progressing   

## 2022-10-09 DIAGNOSIS — M25562 Pain in left knee: Secondary | ICD-10-CM | POA: Diagnosis not present

## 2022-10-12 DIAGNOSIS — M25662 Stiffness of left knee, not elsewhere classified: Secondary | ICD-10-CM | POA: Diagnosis not present

## 2022-10-14 DIAGNOSIS — M25662 Stiffness of left knee, not elsewhere classified: Secondary | ICD-10-CM | POA: Diagnosis not present

## 2022-10-14 DIAGNOSIS — M25562 Pain in left knee: Secondary | ICD-10-CM | POA: Diagnosis not present

## 2022-10-16 DIAGNOSIS — M25562 Pain in left knee: Secondary | ICD-10-CM | POA: Diagnosis not present

## 2022-10-16 DIAGNOSIS — M25662 Stiffness of left knee, not elsewhere classified: Secondary | ICD-10-CM | POA: Diagnosis not present

## 2022-10-19 DIAGNOSIS — M25662 Stiffness of left knee, not elsewhere classified: Secondary | ICD-10-CM | POA: Diagnosis not present

## 2022-10-21 DIAGNOSIS — M25662 Stiffness of left knee, not elsewhere classified: Secondary | ICD-10-CM | POA: Diagnosis not present

## 2022-10-21 DIAGNOSIS — M25562 Pain in left knee: Secondary | ICD-10-CM | POA: Diagnosis not present

## 2022-10-23 DIAGNOSIS — M25562 Pain in left knee: Secondary | ICD-10-CM | POA: Diagnosis not present

## 2022-10-23 DIAGNOSIS — M25662 Stiffness of left knee, not elsewhere classified: Secondary | ICD-10-CM | POA: Diagnosis not present

## 2022-10-26 DIAGNOSIS — M25662 Stiffness of left knee, not elsewhere classified: Secondary | ICD-10-CM | POA: Diagnosis not present

## 2022-10-26 DIAGNOSIS — M25562 Pain in left knee: Secondary | ICD-10-CM | POA: Diagnosis not present

## 2022-10-29 DIAGNOSIS — M25662 Stiffness of left knee, not elsewhere classified: Secondary | ICD-10-CM | POA: Diagnosis not present

## 2022-10-29 DIAGNOSIS — M25562 Pain in left knee: Secondary | ICD-10-CM | POA: Diagnosis not present

## 2022-11-02 DIAGNOSIS — M25562 Pain in left knee: Secondary | ICD-10-CM | POA: Diagnosis not present

## 2022-11-02 DIAGNOSIS — M25662 Stiffness of left knee, not elsewhere classified: Secondary | ICD-10-CM | POA: Diagnosis not present

## 2022-11-05 DIAGNOSIS — M25662 Stiffness of left knee, not elsewhere classified: Secondary | ICD-10-CM | POA: Diagnosis not present

## 2022-11-05 DIAGNOSIS — M25562 Pain in left knee: Secondary | ICD-10-CM | POA: Diagnosis not present

## 2022-11-09 DIAGNOSIS — M25662 Stiffness of left knee, not elsewhere classified: Secondary | ICD-10-CM | POA: Diagnosis not present

## 2022-11-09 DIAGNOSIS — M25562 Pain in left knee: Secondary | ICD-10-CM | POA: Diagnosis not present

## 2022-11-11 DIAGNOSIS — Z5189 Encounter for other specified aftercare: Secondary | ICD-10-CM | POA: Diagnosis not present

## 2022-11-12 DIAGNOSIS — M25562 Pain in left knee: Secondary | ICD-10-CM | POA: Diagnosis not present

## 2022-11-12 DIAGNOSIS — M25662 Stiffness of left knee, not elsewhere classified: Secondary | ICD-10-CM | POA: Diagnosis not present

## 2022-11-16 DIAGNOSIS — M25662 Stiffness of left knee, not elsewhere classified: Secondary | ICD-10-CM | POA: Diagnosis not present

## 2022-11-16 DIAGNOSIS — M25562 Pain in left knee: Secondary | ICD-10-CM | POA: Diagnosis not present

## 2022-11-23 DIAGNOSIS — M25662 Stiffness of left knee, not elsewhere classified: Secondary | ICD-10-CM | POA: Diagnosis not present

## 2022-11-23 DIAGNOSIS — M25562 Pain in left knee: Secondary | ICD-10-CM | POA: Diagnosis not present

## 2022-12-24 ENCOUNTER — Other Ambulatory Visit: Payer: Self-pay | Admitting: Family Medicine

## 2022-12-24 DIAGNOSIS — I10 Essential (primary) hypertension: Secondary | ICD-10-CM

## 2023-02-02 ENCOUNTER — Encounter: Payer: Self-pay | Admitting: Family Medicine

## 2023-02-04 ENCOUNTER — Telehealth: Payer: Medicare Other | Admitting: Family Medicine

## 2023-02-14 ENCOUNTER — Encounter: Payer: Self-pay | Admitting: Family Medicine

## 2023-02-14 DIAGNOSIS — I10 Essential (primary) hypertension: Secondary | ICD-10-CM

## 2023-02-15 ENCOUNTER — Ambulatory Visit: Payer: Medicare Other | Admitting: Internal Medicine

## 2023-02-15 ENCOUNTER — Other Ambulatory Visit: Payer: Self-pay | Admitting: Family Medicine

## 2023-02-15 MED ORDER — AMLODIPINE BESYLATE 10 MG PO TABS: 10.0000 mg | ORAL_TABLET | Freq: Every day | ORAL | 0 refills | Status: DC

## 2023-02-15 MED ORDER — LOSARTAN POTASSIUM-HCTZ 100-12.5 MG PO TABS: 1.0000 | ORAL_TABLET | Freq: Every day | ORAL | 0 refills | Status: DC

## 2023-04-05 ENCOUNTER — Ambulatory Visit: Payer: Medicare Other | Admitting: Internal Medicine

## 2023-05-09 NOTE — Progress Notes (Unsigned)
Cardiology Office Note:  .   Date:  05/10/2023  ID:  Rodney Wade, DOB November 30, 1955, MRN [id number] PCP: Ronnald Nian, MD  [facility] Providers Cardiologist:  Maisie Fus, MD  History of Present Illness: .   Rodney Wade is a 66 y.o. male with a past medical history of aortic valve stenosis, hypertension, migraines, PVCs.  Patient is followed by Dr. Wyline Mood and presents today for a 77-month follow-up appointment.  Per chart review, patient had previously undergone echocardiogram in 04/2019 that showed EF 60-65%, normal RV function mild aortic stenosis.  He was referred to Dr. Wyline Mood in 07/2022 for evaluation of progressive AAS murmur and new PVCs.  At that time, patient denied any limitations with physical activity, chest pain, or shortness of breath.  He underwent echocardiogram on 08/12/2022 that showed EF 60-65%, no regional wall motion abnormalities, grade 1 diastolic dysfunction, normal RV function, mild aortic valve regurgitation, moderate aortic valve stenosis.  Patient underwent nuclear stress test on 08/17/2022 that was a normal, low risk study without evidence of ischemia or past infarction.  Today, patient reports that he has been doing very well from a cardiac perspective. He had his left knee replaced in 09/2022, and has been recovering well. He is able to ride up to 7 miles on his exercise bike without chest pain or shortness of breath. Denies syncope, near syncope, ankle edema, dizziness, palpitations. His PCP had noticed PVCs on an EKG, but patient is asymptomatic with these. He is due for an echocardiogram in 07/2023 for monitoring of his mild AI and moderate AS. Discussed the symptoms of AS    ROS: Denies chest pain, palpitations, shortness of breath, ankle edema, syncope, near syncope, dizziness   Studies Reviewed: .    Cardiac Studies & Procedures     STRESS TESTS  MYOCARDIAL PERFUSION IMAGING 08/17/2022  Narrative   The study is normal. The  study is low risk.   No ST deviation was noted. The ECG was not diagnostic due to pharmacologic protocol.   LV perfusion is normal. There is no evidence of ischemia. There is no evidence of infarction.   Left ventricular function is normal. Nuclear stress EF: 59 %. The left ventricular ejection fraction is normal (55-65%). End diastolic cavity size is normal. End systolic cavity size is normal.   Prior study not available for comparison.   ECHOCARDIOGRAM  ECHOCARDIOGRAM COMPLETE 08/12/2022  Narrative ECHOCARDIOGRAM REPORT    Patient Name:   Rodney Wade Date of Exam: 08/12/2022 Medical Rec #:  [id number]               Height:       67.0 in Accession #:    [id number]              Weight:       190.8 lb Date of Birth:  1955/12/07               BSA:          1.982 m Patient Age:    66 years                BP:           140/88 mmHg Patient Gender: M                       HR:           82 bpm. Exam Location:  Outpatient  Procedure: 2D Echo,  Cardiac Doppler and Color Doppler  Indications:    Pre-operative cardiovascular examination Z01.810  History:        Patient has prior history of Echocardiogram examinations, most recent 04/27/2019. Signs/Symptoms:Murmur; Risk Factors:Hypertension.  Sonographer:    Leta Jungling RDCS Referring Phys: [phone] MARY E BRANCH  IMPRESSIONS   1. Left ventricular ejection fraction, by estimation, is 60 to 65%. The left ventricle has normal function. The left ventricle has no regional wall motion abnormalities. There is mild concentric left ventricular hypertrophy of the basal-septal segment. Left ventricular diastolic parameters are consistent with Grade I diastolic dysfunction (impaired relaxation). 2. Right ventricular systolic function is normal. The right ventricular size is normal. 3. Left atrial size was mildly dilated. 4. The mitral valve is grossly normal. No evidence of mitral valve regurgitation. No evidence of mitral stenosis. 5.  The aortic valve is calcified. There is moderate calcification of the aortic valve. There is mild thickening of the aortic valve. Aortic valve regurgitation is mild. Moderate aortic valve stenosis.  FINDINGS Left Ventricle: Left ventricular ejection fraction, by estimation, is 60 to 65%. The left ventricle has normal function. The left ventricle has no regional wall motion abnormalities. The left ventricular internal cavity size was normal in size. There is mild concentric left ventricular hypertrophy of the basal-septal segment. Left ventricular diastolic parameters are consistent with Grade I diastolic dysfunction (impaired relaxation).  Right Ventricle: The right ventricular size is normal. Right ventricular systolic function is normal.  Left Atrium: Left atrial size was mildly dilated.  Right Atrium: Right atrial size was normal in size.  Pericardium: There is no evidence of pericardial effusion.  Mitral Valve: The mitral valve is grossly normal. Mild mitral annular calcification. No evidence of mitral valve regurgitation. No evidence of mitral valve stenosis.  Tricuspid Valve: The tricuspid valve is grossly normal. Tricuspid valve regurgitation is mild . No evidence of tricuspid stenosis.  Aortic Valve: The aortic valve is calcified. There is moderate calcification of the aortic valve. There is mild thickening of the aortic valve. Aortic valve regurgitation is mild. Aortic regurgitation PHT measures 402 msec. Moderate aortic stenosis is present. Aortic valve mean gradient measures 21.7 mmHg. Aortic valve peak gradient measures 37.9 mmHg. Aortic valve area, by VTI measures 1.37 cm.  Pulmonic Valve: The pulmonic valve was grossly normal. Pulmonic valve regurgitation is trivial. No evidence of pulmonic stenosis.  Aorta: The aortic root and ascending aorta are structurally normal, with no evidence of dilitation.  IAS/Shunts: No atrial level shunt detected by color flow Doppler.   LEFT  VENTRICLE PLAX 2D LVIDd:         4.30 cm   Diastology LVIDs:         2.40 cm   LV e' medial:    7.83 cm/s LV PW:         1.00 cm   LV E/e' medial:  10.0 LV IVS:        1.20 cm   LV e' lateral:   8.27 cm/s LVOT diam:     2.30 cm   LV E/e' lateral: 9.5 LV SV:         86 LV SV Index:   43 LVOT Area:     4.15 cm   RIGHT VENTRICLE RV S prime:     20.20 cm/s TAPSE (M-mode): 2.2 cm  LEFT ATRIUM             Index        RIGHT ATRIUM  Index LA diam:        4.30 cm 2.17 cm/m   RA Area:     14.00 cm LA Vol (A2C):   56.7 ml 28.60 ml/m  RA Volume:   34.50 ml  17.41 ml/m LA Vol (A4C):   63.2 ml 31.88 ml/m LA Biplane Vol: 62.7 ml 31.63 ml/m AORTIC VALVE AV Area (Vmax):    1.46 cm AV Area (Vmean):   1.32 cm AV Area (VTI):     1.37 cm AV Vmax:           307.67 cm/s AV Vmean:          221.000 cm/s AV VTI:            0.628 m AV Peak Grad:      37.9 mmHg AV Mean Grad:      21.7 mmHg LVOT Vmax:         108.00 cm/s LVOT Vmean:        70.300 cm/s LVOT VTI:          0.207 m LVOT/AV VTI ratio: 0.33 AI PHT:            402 msec  AORTA Ao Root diam: 3.30 cm Ao Asc diam:  3.20 cm  MITRAL VALVE MV Area (PHT): 2.54 cm    SHUNTS MV Decel Time: 299 msec    Systemic VTI:  0.21 m MV E velocity: 78.50 cm/s  Systemic Diam: 2.30 cm MV A velocity: 97.60 cm/s MV E/A ratio:  0.80  Olga Millers MD Electronically signed by Olga Millers MD Signature Date/Time: 08/12/2022/3:14:37 PM    Final             Risk Assessment/Calculations:             Physical Exam:   VS:  BP 138/86   Pulse 81   Ht 5\' 7"  (1.702 m)   Wt 198 lb (89.8 kg)   SpO2 98%   BMI 31.01 kg/m    Wt Readings from Last 3 Encounters:  05/10/23 198 lb (89.8 kg)  10/05/22 191 lb (86.6 kg)  09/23/22 191 lb (86.6 kg)    GEN: Well nourished, well developed in no acute distress. Sitting comfortably in the chair  NECK: No JVD CARDIAC: RRR. Grade 2/6 systolic murmur at RUSB  RESPIRATORY:  Clear to  auscultation without rales, wheezing or rhonchi. Normal work of breathing on room air   ABDOMEN: Soft, non-tender, non-distended EXTREMITIES:  No edema in BLE; No deformity   ASSESSMENT AND PLAN: .    Mild aortic valve regurgitation Moderate aortic valve stenosis -Previously, echocardiogram in 2020 had shown mild aortic valve regurgitation, mild aortic valve stenosis -Echocardiogram from 07/2022 showed mild aortic valve regurgitation with moderate aortic valve stenosis. Mean gradient measured 21.7 - He denies chest pain, shortness of breath, syncope/near syncope  - Discussed progression of AS and symptoms of AS including chest pain, sob, near syncope/syncope  - Ordered repeat echocardiogram to be completed in 07/2023 for monitoring  PVCs  - Echo in 07/2022 showed EF 60-65%, no regional wall motion abnormalities, normal RV function  - Stress test in 08/2022 was without ischemia or infarction  - Patient is asymptomatic and cannot feel PVCS - continue to monitor for now. Repeating echo as above  - Discussed PVC triggers including caffeine, stress, alcohol, dehydration   HTN - Continue amlodipine 10 mg daily, losartan-hydrochlorothiazide 100-12.5 mg daily  - Managed by PCP   Dispo: Follow up in 1 year with me  or Dr. Wyline Mood   Signed, Jonita Albee, PA-C

## 2023-05-10 ENCOUNTER — Encounter: Payer: Self-pay | Admitting: Cardiology

## 2023-05-10 ENCOUNTER — Ambulatory Visit: Payer: Medicare Other | Attending: Internal Medicine | Admitting: Cardiology

## 2023-05-10 VITALS — BP 138/86 | HR 81 | Ht 67.0 in | Wt 198.0 lb

## 2023-05-10 DIAGNOSIS — I10 Essential (primary) hypertension: Secondary | ICD-10-CM | POA: Diagnosis not present

## 2023-05-10 DIAGNOSIS — I493 Ventricular premature depolarization: Secondary | ICD-10-CM

## 2023-05-10 DIAGNOSIS — I351 Nonrheumatic aortic (valve) insufficiency: Secondary | ICD-10-CM | POA: Diagnosis not present

## 2023-05-10 DIAGNOSIS — I350 Nonrheumatic aortic (valve) stenosis: Secondary | ICD-10-CM | POA: Diagnosis not present

## 2023-05-10 NOTE — Patient Instructions (Signed)
Medication Instructions:  Your physician recommends that you continue on your current medications as directed. Please refer to the Current Medication list given to you today.  *If you need a refill on your cardiac medications before your next appointment, please call your pharmacy*   Lab Work: NONE ordered at this time of appointment     Testing/Procedures: Your physician has requested that you have an echocardiogram. Echocardiography is a painless test that uses sound waves to create images of your heart. It provides your doctor with information about the size and shape of your heart and how well your heart's chambers and valves are working. This procedure takes approximately one hour. There are no restrictions for this procedure. Please do NOT wear cologne, perfume, aftershave, or lotions (deodorant is allowed). Please arrive 15 minutes prior to your appointment time.     Follow-Up: At [facility], you and your health needs are our priority.  As part of our continuing mission to provide you with exceptional heart care, we have created designated Provider Care Teams.  These Care Teams include your primary Cardiologist (physician) and Advanced Practice Providers (APPs -  Physician Assistants and Nurse Practitioners) who all work together to provide you with the care you need, when you need it.  We recommend signing up for the patient portal called "MyChart".  Sign up information is provided on this After Visit Summary.  MyChart is used to connect with patients for Virtual Visits (Telemedicine).  Patients are able to view lab/test results, encounter notes, upcoming appointments, etc.  Non-urgent messages can be sent to your provider as well.   To learn more about what you can do with MyChart, go to [email or web address].    Your next appointment:   1 year(s)  Provider:   Maisie Fus, MD  or Robet Leu, PA-C

## 2023-05-26 DIAGNOSIS — Z96652 Presence of left artificial knee joint: Secondary | ICD-10-CM | POA: Diagnosis not present

## 2023-06-24 ENCOUNTER — Other Ambulatory Visit: Payer: Self-pay | Admitting: Family Medicine

## 2023-07-29 ENCOUNTER — Encounter: Payer: Self-pay | Admitting: Family Medicine

## 2023-07-29 ENCOUNTER — Encounter: Payer: Self-pay | Admitting: Internal Medicine

## 2023-08-02 ENCOUNTER — Ambulatory Visit (HOSPITAL_COMMUNITY): Payer: Medicare Other | Attending: Cardiology

## 2023-08-02 DIAGNOSIS — I350 Nonrheumatic aortic (valve) stenosis: Secondary | ICD-10-CM | POA: Diagnosis not present

## 2023-08-02 LAB — ECHOCARDIOGRAM COMPLETE
AR max vel: 1.4 cm2
AV Area VTI: 1.48 cm2
AV Area mean vel: 1.31 cm2
AV Mean grad: 29 mm[Hg]
AV Peak grad: 48 mm[Hg]
Ao pk vel: 3.46 m/s
Area-P 1/2: 4.74 cm2
P 1/2 time: 345 ms
S' Lateral: 2.3 cm

## 2023-08-04 ENCOUNTER — Encounter: Payer: Self-pay | Admitting: Family Medicine

## 2023-08-04 ENCOUNTER — Telehealth: Payer: Self-pay

## 2023-08-04 ENCOUNTER — Telehealth (INDEPENDENT_AMBULATORY_CARE_PROVIDER_SITE_OTHER): Payer: Medicare Other | Admitting: Family Medicine

## 2023-08-04 VITALS — Wt 180.0 lb

## 2023-08-04 DIAGNOSIS — F4321 Adjustment disorder with depressed mood: Secondary | ICD-10-CM

## 2023-08-04 DIAGNOSIS — F338 Other recurrent depressive disorders: Secondary | ICD-10-CM | POA: Diagnosis not present

## 2023-08-04 MED ORDER — BUPROPION HCL ER (SR) 150 MG PO TB12: 150.0000 mg | ORAL_TABLET | Freq: Every day | ORAL | 3 refills | Status: DC

## 2023-08-04 NOTE — Telephone Encounter (Signed)
Left message to call back Typo on when to repeat look note here   Repeat in 6 months, not years   Thanks  KJ

## 2023-08-04 NOTE — Telephone Encounter (Signed)
-----   Message from Jonita Albee sent at 08/04/2023  8:13 AM EST ----- Please tell patient that their echocardiogram showed EF normal at 60-65%. There were no regional wall motion abnormalities. Heart walls are a bit thick (mild), which can happen with age. RV functioning normally. The aortic valve continues to be thick with moderate stenosis. Aortic stenosis was moderate in 07/2022 as well, so valve has been stable.   Repeat echo in 6 year for monitoring   Thanks KJ
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
## 2023-08-05 ENCOUNTER — Telehealth: Payer: Self-pay

## 2023-08-05 ENCOUNTER — Encounter: Payer: Self-pay | Admitting: Family Medicine

## 2023-08-05 DIAGNOSIS — I350 Nonrheumatic aortic (valve) stenosis: Secondary | ICD-10-CM

## 2023-08-05 NOTE — Telephone Encounter (Signed)
Called patient advised of below they verbalized understanding. Ordered echo and sent message to scheduling.

## 2023-08-05 NOTE — Telephone Encounter (Signed)
-----   Message from Jonita Albee sent at 08/04/2023  8:13 AM EST ----- Please tell patient that their echocardiogram showed EF normal at 60-65%. There were no regional wall motion abnormalities. Heart walls are a bit thick (mild), which can happen with age. RV functioning normally. The aortic valve continues to be thick with moderate stenosis. Aortic stenosis was moderate in 07/2022 as well, so valve has been stable.   Repeat echo in 6 year for monitoring   Thanks KJ

## 2023-08-08 ENCOUNTER — Encounter: Payer: Self-pay | Admitting: Family Medicine

## 2023-08-16 ENCOUNTER — Encounter: Payer: Self-pay | Admitting: Internal Medicine

## 2023-08-24 ENCOUNTER — Encounter: Payer: Self-pay | Admitting: Family Medicine

## 2023-08-24 ENCOUNTER — Ambulatory Visit (INDEPENDENT_AMBULATORY_CARE_PROVIDER_SITE_OTHER): Payer: Medicare Other | Admitting: Family Medicine

## 2023-08-24 VITALS — BP 120/80 | HR 84 | Ht 67.0 in | Wt 195.2 lb

## 2023-08-24 DIAGNOSIS — Z0000 Encounter for general adult medical examination without abnormal findings: Secondary | ICD-10-CM

## 2023-08-24 DIAGNOSIS — Z23 Encounter for immunization: Secondary | ICD-10-CM

## 2023-08-24 DIAGNOSIS — R5382 Chronic fatigue, unspecified: Secondary | ICD-10-CM

## 2023-08-24 DIAGNOSIS — I350 Nonrheumatic aortic (valve) stenosis: Secondary | ICD-10-CM | POA: Diagnosis not present

## 2023-08-24 DIAGNOSIS — Z13220 Encounter for screening for lipoid disorders: Secondary | ICD-10-CM | POA: Diagnosis not present

## 2023-08-24 DIAGNOSIS — I158 Other secondary hypertension: Secondary | ICD-10-CM

## 2023-08-24 DIAGNOSIS — Z860101 Personal history of adenomatous and serrated colon polyps: Secondary | ICD-10-CM

## 2023-08-24 DIAGNOSIS — Z8669 Personal history of other diseases of the nervous system and sense organs: Secondary | ICD-10-CM

## 2023-08-24 MED ORDER — LOSARTAN POTASSIUM-HCTZ 100-12.5 MG PO TABS: 1.0000 | ORAL_TABLET | Freq: Every day | ORAL | 3 refills | Status: DC

## 2023-08-24 MED ORDER — AMLODIPINE BESYLATE 10 MG PO TABS: 10.0000 mg | ORAL_TABLET | Freq: Every day | ORAL | 3 refills | Status: DC

## 2023-08-24 NOTE — Progress Notes (Addendum)
Rodney Wade is a 67 y.o. male who presents for annual wellness visit and follow-up on chronic medical conditions.  He has noted difficulty with decreased energy, stamina and strength but says his libido seems to be doing okay.  He has concerns over his testosterone because of this.  Also he was given Wellbutrin to help with some psychological issues but stopped it due to unacceptable side effects.  In the past SSRIs that also caused difficulty.  At this point he wants to handle it on his own.  He continues on his amlodipine and losartan for blood pressure.  He also has had a partial left knee and is using meloxicam for that.  He has been doing exercises.  He does have underlying aortic stenosis and is in follow-up with cardiology concerning this.  He did have a colonoscopy in 2021 he has no other concerns.   Immunizations and Health Maintenance Immunization History  Administered Date(s) Administered   Fluad Quad(high Dose 65+) 06/13/2019, 07/08/2020, 07/14/2021   Fluad Trivalent(High Dose 65+) 08/24/2023   Influenza Split 09/21/2011   Influenza Whole 06/30/2010   Influenza,inj,Quad PF,6+ Mos 05/27/2017, 05/31/2018   Influenza-Unspecified 05/30/2022   PFIZER Comirnaty(Gray Top)Covid-19 Tri-Sucrose Vaccine 06/29/2022   PFIZER(Purple Top)SARS-COV-2 Vaccination 10/04/2019, 10/23/2019, 06/18/2020   Pfizer Covid-19 Vaccine Bivalent Booster 25yrs & up 06/03/2021   Pneumococcal Conjugate-13 06/13/2019   Pneumococcal Polysaccharide-23 07/08/2020   Tdap 04/14/2009, 07/04/2019   Zoster Recombinant(Shingrix) 03/02/2017, 05/27/2017   Zoster, Live 04/08/2015   Health Maintenance Due  Topic Date Due   Colonoscopy  04/09/2023   Medicare Annual Wellness (AWV)  08/11/2023    Last colonoscopy:2021 Last PSA:2022 Dentist:2x2024 Ophtho:not sure Exercise:daily, as much as he can  Other doctors caring for patient include: ZO:[facility] Optho:[facility] Dentist: Dr. Chestine Spore Ortho:[facility] Card:Dr.Branch  Advanced Directives: LIving will and POA on file Does Patient Have a Medical Advance Directive?: Yes Type of Advance Directive: Healthcare Power of [place], Living will, Out of facility DNR (pink MOST or yellow form) Does patient want to make changes to medical advance directive?: Yes (MAU/Ambulatory/Procedural Areas - Information given) Copy of Healthcare Power of Attorney in Chart?: Yes - validated most recent copy scanned in chart (See row information)  Depression screen:  See questionnaire below.        08/24/2023    8:15 AM 08/04/2023    4:25 PM 08/10/2022    1:35 PM 01/12/2022    9:06 AM 07/14/2021    8:24 AM  Depression screen PHQ 2/9  Decreased Interest 0 0 0 0 0  Down, Depressed, Hopeless 0 1 0 0 0  PHQ - 2 Score 0 1 0 0 0    Fall Screen: See Questionaire below.      08/24/2023    8:14 AM 08/10/2022    1:35 PM 07/20/2022    9:07 AM 07/14/2021    8:22 AM 07/08/2020    1:44 PM  Fall Risk   Falls in the past year? 0 0 0 0 1  Number falls in past yr: 0 0 0 0 0  Injury with Fall? 0 0 0 0 0  Risk for fall due to : No Fall Risks No Fall Risks  No Fall Risks No Fall Risks  Follow up Falls evaluation completed Falls evaluation completed  Falls evaluation completed     ADL screen:  See questionnaire below.  Functional Status Survey: Is the patient deaf or have difficulty hearing?: No Does the patient have difficulty seeing, even when wearing  glasses/contacts?: No Does the patient have difficulty concentrating, remembering, or making decisions?: Yes (age related) Does the patient have difficulty walking or climbing stairs?: No Does the patient have difficulty dressing or bathing?: No Does the patient have difficulty doing errands alone such as visiting a doctor's office or shopping?: No   Review of Systems  Constitutional: -, -unexpected weight change, -anorexia, -fatigue Allergy: -sneezing, -itching, -congestion Dermatology:  denies changing moles, rash, lumps ENT: -runny nose, -ear pain, -sore throat,  Cardiology:  -chest pain, -palpitations, -orthopnea, Respiratory: -cough, -shortness of breath, -dyspnea on exertion, -wheezing,  Gastroenterology: -abdominal pain, -nausea, -vomiting, -diarrhea, -constipation, -dysphagia Hematology: -bleeding or bruising problems Musculoskeletal: -arthralgias, -myalgias, -joint swelling, -back pain, - Ophthalmology: -vision changes,  Urology: -dysuria, -difficulty urinating,  -urinary frequency, -urgency, incontinence Neurology: -, -numbness, , -memory loss, -falls, -dizziness    PHYSICAL EXAM:  BP 120/80   Pulse 84   Ht 5\' 7"  (1.702 m)   Wt 195 lb 3.2 oz (88.5 kg)   BMI 30.57 kg/m   General Appearance: Alert, cooperative, no distress, appears stated age Head: Normocephalic, without obvious abnormality, atraumatic Eyes: PERRL, conjunctiva/corneas clear, EOM's intact,  Ears: Normal TM's and external ear canals Nose: Nares normal, mucosa normal, no drainage or sinus   tenderness Throat: Lips, mucosa, and tongue normal; teeth and gums normal Neck: Supple, no lymphadenopathy, thyroid:no enlargement/tenderness/nodules; no carotid bruit or JVD Lungs: Clear to auscultation bilaterally without wheezes, rales or ronchi; respirations unlabored Heart: Regular rate and rhythm, S1 and S2 normal, 3/6 SEM heard at the right sternal border with radiation into the neck.   Abdomen: Soft, non-tender, nondistended, normoactive bowel sounds, no masses, no hepatosplenomegaly Extremities: No clubbing, cyanosis or edema Pulses: 2+ and symmetric all extremities Skin: Skin color, texture, turgor normal, no rashes or lesions Lymph nodes: Cervical, supraclavicular, and axillary nodes normal Neurologic: CNII-XII intact, normal strength, sensation and gait; reflexes 2+ and symmetric throughout   Psych: Normal mood, affect, hygiene and grooming  ASSESSMENT/PLAN: Routine general medical  examination at a health care facility - Plan: CBC with Differential/Platelet, Comprehensive metabolic panel, Lipid panel  Aortic stenosis with trileaflet valve  Hx of adenomatous colonic polyps  Other secondary hypertension - Plan: CBC with Differential/Platelet, Comprehensive metabolic panel, losartan-hydrochlorothiazide (HYZAAR) 100-12.5 MG tablet, amLODipine (NORVASC) 10 MG tablet  Screening for lipid disorders - Plan: Lipid panel  Need for influenza vaccination - Plan: Flu Vaccine Trivalent High Dose (Fluad)  Chronic fatigue - Plan: Testosterone   Discussed how he is handling himself psychologically and at this point through this.  If he runs into difficulty, he will call me.  Encouraged him to continue with taking good care of himself as well as his knee. Discussed  at least 30 minutes of aerobic activity at least 5 days/week;  Immunization recommendations discussed.  Colonoscopy recommendations reviewed.   Medicare Attestation I have personally reviewed: The patient's medical and social history Their use of alcohol, tobacco or illicit drugs Their current medications and supplements The patient's functional ability including ADLs,fall risks, home safety risks, cognitive, and hearing and visual impairment Diet and physical activities Evidence for depression or mood disorders  The patient's weight, height, and BMI have been recorded in the chart.  I have made referrals, counseling, and provided education to the patient based on review of the above and I have provided the patient with a written personalized care plan for preventive services.     Sharlot Gowda, MD   08/24/2023

## 2023-08-25 LAB — COMPREHENSIVE METABOLIC PANEL
ALT: 27 [IU]/L (ref 0–44)
AST: 29 [IU]/L (ref 0–40)
Albumin: 4.5 g/dL (ref 3.9–4.9)
Alkaline Phosphatase: 63 [IU]/L (ref 44–121)
BUN/Creatinine Ratio: 20 (ref 10–24)
BUN: 15 mg/dL (ref 8–27)
Bilirubin Total: 0.4 mg/dL (ref 0.0–1.2)
CO2: 24 mmol/L (ref 20–29)
Calcium: 9.4 mg/dL (ref 8.6–10.2)
Chloride: 102 mmol/L (ref 96–106)
Creatinine, Ser: 0.76 mg/dL (ref 0.76–1.27)
Globulin, Total: 2.5 g/dL (ref 1.5–4.5)
Glucose: 99 mg/dL (ref 70–99)
Potassium: 4.8 mmol/L (ref 3.5–5.2)
Sodium: 141 mmol/L (ref 134–144)
Total Protein: 7 g/dL (ref 6.0–8.5)
eGFR: 97 mL/min/{1.73_m2} (ref >59)

## 2023-08-25 LAB — CBC WITH DIFFERENTIAL/PLATELET
Basophils Absolute: 0 10*3/uL (ref 0.0–0.2)
Basos: 1 %
EOS (ABSOLUTE): 0.3 10*3/uL (ref 0.0–0.4)
Eos: 5 %
Hematocrit: 43.8 % (ref 37.5–51.0)
Hemoglobin: 14.8 g/dL (ref 13.0–17.7)
Immature Grans (Abs): 0 10*3/uL (ref 0.0–0.1)
Immature Granulocytes: 0 %
Lymphocytes Absolute: 2.2 10*3/uL (ref 0.7–3.1)
Lymphs: 40 %
MCH: 33 pg (ref 26.6–33.0)
MCHC: 33.8 g/dL (ref 31.5–35.7)
MCV: 98 fL — ABNORMAL HIGH (ref 79–97)
Monocytes Absolute: 0.6 10*3/uL (ref 0.1–0.9)
Monocytes: 11 %
Neutrophils Absolute: 2.4 10*3/uL (ref 1.4–7.0)
Neutrophils: 43 %
Platelets: 284 10*3/uL (ref 150–450)
RBC: 4.48 x10E6/uL (ref 4.14–5.80)
RDW: 13 % (ref 11.6–15.4)
WBC: 5.6 10*3/uL (ref 3.4–10.8)

## 2023-08-25 LAB — LIPID PANEL
Chol/HDL Ratio: 2.5 {ratio} (ref 0.0–5.0)
Cholesterol, Total: 169 mg/dL (ref 100–199)
HDL: 67 mg/dL (ref >39)
LDL Chol Calc (NIH): 88 mg/dL (ref 0–99)
Triglycerides: 74 mg/dL (ref 0–149)
VLDL Cholesterol Cal: 14 mg/dL (ref 5–40)

## 2023-08-25 LAB — TESTOSTERONE: Testosterone: 342 ng/dL (ref 264–916)

## 2023-09-19 ENCOUNTER — Other Ambulatory Visit: Payer: Self-pay | Admitting: Family Medicine

## 2023-09-19 DIAGNOSIS — I158 Other secondary hypertension: Secondary | ICD-10-CM

## 2023-09-23 DIAGNOSIS — Z96652 Presence of left artificial knee joint: Secondary | ICD-10-CM | POA: Diagnosis not present

## 2023-10-11 ENCOUNTER — Telehealth: Payer: Self-pay | Admitting: *Deleted

## 2023-10-11 NOTE — Telephone Encounter (Signed)
Hi John, does this pt's aortic stenosis disqualify him to be done at [facility]? Thanks

## 2023-10-19 ENCOUNTER — Ambulatory Visit (AMBULATORY_SURGERY_CENTER): Payer: Medicare Other

## 2023-10-19 VITALS — Ht 67.0 in | Wt 180.0 lb

## 2023-10-19 DIAGNOSIS — Z860100 Personal history of colon polyps, unspecified: Secondary | ICD-10-CM

## 2023-10-19 MED ORDER — SUTAB 1479-225-188 MG PO TABS: 12.0000 | ORAL_TABLET | ORAL | 0 refills | Status: DC

## 2023-10-19 NOTE — Progress Notes (Signed)
No egg or soy allergy known to patient  No issues known to pt with past sedation with any surgeries or procedures Patient denies ever being told they had issues or difficulty with intubation  No FH of Malignant Hyperthermia Pt is not on diet pills Pt is not on  home 02  Pt is not on blood thinners  Pt denies issues with constipation takes stool softener No A fib or A flutter Have any cardiac testing pending--no Pt can ambulate independently Pt denies use of chewing tobacco Discussed diabetic I weight loss medication holds Discussed NSAID holds Checked BMI Pt instructed to use Singlecare.com or GoodRx for a price reduction on prep  Patient's chart reviewed by Cathlyn Parsons CNRA prior to previsit and patient appropriate for the [facility].  Pre visit completed and red dot placed by patient's name on their procedure day (on provider's schedule).

## 2023-11-02 ENCOUNTER — Encounter: Payer: Medicare Other | Admitting: Internal Medicine

## 2023-11-09 ENCOUNTER — Encounter: Payer: Self-pay | Admitting: Internal Medicine

## 2023-11-15 ENCOUNTER — Other Ambulatory Visit: Payer: Self-pay

## 2023-11-15 ENCOUNTER — Encounter: Payer: Self-pay | Admitting: Family Medicine

## 2023-11-15 DIAGNOSIS — I158 Other secondary hypertension: Secondary | ICD-10-CM

## 2023-11-15 MED ORDER — AMLODIPINE BESYLATE 10 MG PO TABS: 10.0000 mg | ORAL_TABLET | Freq: Every day | ORAL | 3 refills | Status: DC

## 2023-11-16 ENCOUNTER — Ambulatory Visit: Payer: Medicare Other | Admitting: Internal Medicine

## 2023-11-16 ENCOUNTER — Encounter: Payer: Self-pay | Admitting: Internal Medicine

## 2023-11-16 VITALS — BP 148/95 | HR 79 | Temp 98.8°F | Resp 17 | Ht 67.0 in | Wt 180.0 lb

## 2023-11-16 DIAGNOSIS — Z1211 Encounter for screening for malignant neoplasm of colon: Secondary | ICD-10-CM | POA: Diagnosis not present

## 2023-11-16 DIAGNOSIS — K5730 Diverticulosis of large intestine without perforation or abscess without bleeding: Secondary | ICD-10-CM | POA: Diagnosis not present

## 2023-11-16 DIAGNOSIS — I2510 Atherosclerotic heart disease of native coronary artery without angina pectoris: Secondary | ICD-10-CM | POA: Diagnosis not present

## 2023-11-16 DIAGNOSIS — D123 Benign neoplasm of transverse colon: Secondary | ICD-10-CM | POA: Diagnosis not present

## 2023-11-16 DIAGNOSIS — Z860100 Personal history of colon polyps, unspecified: Secondary | ICD-10-CM | POA: Diagnosis not present

## 2023-11-16 DIAGNOSIS — M109 Gout, unspecified: Secondary | ICD-10-CM | POA: Diagnosis not present

## 2023-11-16 DIAGNOSIS — K644 Residual hemorrhoidal skin tags: Secondary | ICD-10-CM | POA: Diagnosis not present

## 2023-11-16 MED ORDER — SODIUM CHLORIDE 0.9 % IV SOLN: 500.0000 mL | Freq: Once | INTRAVENOUS | Status: AC

## 2023-11-16 NOTE — Progress Notes (Signed)
 Pt's states no medical or surgical changes since previsit or office visit.

## 2023-11-16 NOTE — Patient Instructions (Signed)

## 2023-11-16 NOTE — Op Note (Signed)
 [facility] Patient Name: Rodney Wade Procedure Date: 11/16/2023 10:28 AM MRN: [id number] Endoscopist: Beverley Fiedler , MD, [id number] Age: 68 Referring MD:  Date of Birth: 1955/12/20 Gender: Male Account #: [id number] Procedure:                Colonoscopy Indications:              High risk colon cancer surveillance: Personal                            history of multiple adenomas, Last colonoscopy:                            July 2021 (TA x 4), May 2016 (TA x 1) Medicines:                Monitored Anesthesia Care Procedure:                Pre-Anesthesia Assessment:                           - Prior to the procedure, a History and Physical                            was performed, and patient medications and                            allergies were reviewed. The patient's tolerance of                            previous anesthesia was also reviewed. The risks                            and benefits of the procedure and the sedation                            options and risks were discussed with the patient.                            All questions were answered, and informed consent                            was obtained. Prior Anticoagulants: The patient has                            taken no anticoagulant or antiplatelet agents. ASA                            Grade Assessment: III - A patient with severe                            systemic disease. After reviewing the risks and                            benefits, the patient was deemed in satisfactory  condition to undergo the procedure.                           After obtaining informed consent, the colonoscope                            was passed under direct vision. Throughout the                            procedure, the patient's blood pressure, pulse, and                            oxygen saturations were monitored continuously. The                            Olympus Scope SN:  [id number] was introduced through                            the anus and advanced to the terminal ileum. The                            colonoscopy was performed without difficulty. The                            patient tolerated the procedure well. The quality                            of the bowel preparation was excellent. The                            ileocecal valve, appendiceal orifice, and rectum                            were photographed. Scope In: 10:33:02 AM Scope Out: 10:44:23 AM Scope Withdrawal Time: 0 hours 10 minutes 0 seconds  Total Procedure Duration: 0 hours 11 minutes 21 seconds  Findings:                 The digital rectal exam was normal.                           The terminal ileum appeared normal.                           A 5 mm polyp was found in the transverse colon. The                            polyp was sessile. The polyp was removed with a                            cold snare. Resection and retrieval were complete.                           Multiple small-mouthed diverticula were found in  the sigmoid colon, descending colon and transverse                            colon.                           External hemorrhoids were found during                            retroflexion. The hemorrhoids were small. Complications:            No immediate complications. Estimated Blood Loss:     Estimated blood loss: none. Impression:               - The examined portion of the ileum was normal.                           - One 5 mm polyp in the transverse colon, removed                            with a cold snare. Resected and retrieved.                           - Mild diverticulosis in the sigmoid colon, in the                            descending colon and in the transverse colon.                           - Small external hemorrhoids. Recommendation:           - Patient has a contact number available for                             emergencies. The signs and symptoms of potential                            delayed complications were discussed with the                            patient. Return to normal activities tomorrow.                            Written discharge instructions were provided to the                            patient.                           - Resume previous diet.                           - Continue present medications.                           - Await pathology results.                           -  Repeat colonoscopy in 5 years for surveillance. Beverley Fiedler, MD 11/16/2023 10:48:09 AM This report has been signed electronically.

## 2023-11-16 NOTE — Progress Notes (Signed)
 GASTROENTEROLOGY PROCEDURE H&P NOTE   Primary Care Physician: Ronnald Nian, MD    Reason for Procedure:  History for history of colonic polyps  Plan:    Colonoscopy  Patient is appropriate for endoscopic procedure(s) in the ambulatory ([facility]) setting.  The nature of the procedure, as well as the risks, benefits, and alternatives were carefully and thoroughly reviewed with the patient. Ample time for discussion and questions allowed. The patient understood, was satisfied, and agreed to proceed.     HPI: Rodney Wade is a 68 y.o. male who presents for colonoscopy.  Medical history as below.  Tolerated the prep.  No recent chest pain or shortness of breath.  No abdominal pain today.  Past Medical History:  Diagnosis Date   Anxiety    Arthritis    Coronary artery disease    Dysrhythmia    Gout    Heart murmur    HH (hiatus hernia)    History of benign essential tremor    Hypertension    Migraine headache     Past Surgical History:  Procedure Laterality Date   bone spur removal Right 2006   COLONOSCOPY     GANGLION CYST EXCISION     PARTIAL KNEE ARTHROPLASTY Left 10/05/2022   Procedure: Left knee medial unicompartmental arthroplasty;  Surgeon: Ollen Gross, MD;  Location: [facility];  Service: Orthopedics;  Laterality: Left;    Prior to Admission medications   Medication Sig Start Date End Date Taking? Authorizing Provider  amLODipine (NORVASC) 10 MG tablet Take 1 tablet (10 mg total) by mouth daily. 11/15/23  Yes Ronnald Nian, MD  losartan-hydrochlorothiazide (HYZAAR) 100-12.5 MG tablet Take 1 tablet by mouth daily. 08/24/23  Yes Ronnald Nian, MD  meloxicam (MOBIC) 15 MG tablet Take 15 mg by mouth daily. 07/22/23  Yes [provider]  Multiple Vitamin (MULTIVITAMIN WITH MINERALS) TABS tablet Take 1 tablet by mouth daily.   Yes [provider]  ALPRAZolam (XANAX) 0.5 MG tablet Take 1 tablet (0.5 mg total) by mouth 2 (two) times daily as  needed for anxiety. 08/31/22   Ronnald Nian, MD    Current Outpatient Medications  Medication Sig Dispense Refill   amLODipine (NORVASC) 10 MG tablet Take 1 tablet (10 mg total) by mouth daily. 90 tablet 3   losartan-hydrochlorothiazide (HYZAAR) 100-12.5 MG tablet Take 1 tablet by mouth daily. 90 tablet 3   meloxicam (MOBIC) 15 MG tablet Take 15 mg by mouth daily.     Multiple Vitamin (MULTIVITAMIN WITH MINERALS) TABS tablet Take 1 tablet by mouth daily.     ALPRAZolam (XANAX) 0.5 MG tablet Take 1 tablet (0.5 mg total) by mouth 2 (two) times daily as needed for anxiety. 60 tablet 0   Current Facility-Administered Medications  Medication Dose Route Frequency Provider Last Rate Last Admin   0.9 %  sodium chloride infusion  500 mL Intravenous Once Sanii Kukla, Carie Caddy, MD       0.9 %  sodium chloride infusion  500 mL Intravenous Once Warren Kugelman, Carie Caddy, MD        Allergies as of 11/16/2023 - Review Complete 11/16/2023  Allergen Reaction Noted   Ace inhibitors Swelling 03/02/2017    Family History  Problem Relation Age of Onset   Hypertension Mother    Hypertension Father    Colon cancer Neg Hx    Allergic rhinitis Neg Hx    Angioedema Neg Hx    Asthma Neg Hx    Eczema Neg Hx  Colon polyps Neg Hx    Stomach cancer Neg Hx    Esophageal cancer Neg Hx    Rectal cancer Neg Hx     Social History   Socioeconomic History   Marital status: Divorced    Spouse name: Not on file   Number of children: Not on file   Years of education: Not on file   Highest education level: Doctorate  Occupational History   Not on file  Tobacco Use   Smoking status: Never   Smokeless tobacco: Never  Vaping Use   Vaping status: Never Used  Substance and Sexual Activity   Alcohol use: Yes    Alcohol/week: 4.0 standard drinks of alcohol    Types: 4 Glasses of wine per week    Comment: occasional   Drug use: No   Sexual activity: Yes  Other Topics Concern   Not on file  Social History Narrative    Not on file   Social Drivers of Health   Financial Resource Strain: Low Risk  (12/24/2021)   Overall Financial Resource Strain (CARDIA)    Difficulty of Paying Living Expenses: Not hard at all  Food Insecurity: No Food Insecurity (10/06/2022)   Hunger Vital Sign    Worried About Running Out of Food in the Last Year: Never true    Ran Out of Food in the Last Year: Never true  Transportation Needs: No Transportation Needs (10/06/2022)   PRAPARE - Administrator, Civil Service (Medical): No    Lack of Transportation (Non-Medical): No  Physical Activity: Sufficiently Active (12/24/2021)   Exercise Vital Sign    Days of Exercise per Week: 4 days    Minutes of Exercise per Session: 50 min  Stress: No Stress Concern Present (12/24/2021)   [employer] of Occupational Health - Occupational Stress Questionnaire    Feeling of Stress : Not at all  Social Connections: Unknown (12/24/2021)   Social Connection and Isolation Panel [NHANES]    Frequency of Communication with Friends and Family: More than three times a week    Frequency of Social Gatherings with Friends and Family: Twice a week    Attends Religious Services: More than 4 times per year    Active Member of [employer] or Organizations: Not on file    Attends Banker Meetings: Not on file    Marital Status: Divorced  Intimate Partner Violence: Not At Risk (10/06/2022)   Humiliation, Afraid, Rape, and Kick questionnaire    Fear of Current or Ex-Partner: No    Emotionally Abused: No    Physically Abused: No    Sexually Abused: No    Physical Exam: Vital signs in last 24 hours: @BP  (!) 154/82   Pulse 84   Temp 98.8 F (37.1 C)   Ht 5\' 7"  (1.702 m)   Wt 180 lb (81.6 kg)   SpO2 95%   BMI 28.19 kg/m  GEN: NAD EYE: Sclerae anicteric ENT: MMM CV: Non-tachycardic Pulm: CTA b/l GI: Soft, NT/ND NEURO:  Alert & Oriented x 3   Erick Blinks, MD [facility]  11/16/2023 10:18 AM

## 2023-11-16 NOTE — Progress Notes (Signed)
 Report to PACU, RN, vss, BBS= Clear.

## 2023-11-17 ENCOUNTER — Telehealth: Payer: Self-pay | Admitting: *Deleted

## 2023-11-17 NOTE — Telephone Encounter (Signed)
 Left message on f/u call

## 2023-11-18 ENCOUNTER — Encounter: Payer: Self-pay | Admitting: Internal Medicine

## 2023-11-18 LAB — SURGICAL PATHOLOGY

## 2023-12-30 ENCOUNTER — Encounter: Payer: Self-pay | Admitting: Family Medicine

## 2024-02-01 ENCOUNTER — Ambulatory Visit (HOSPITAL_COMMUNITY)
Admission: RE | Admit: 2024-02-01 | Discharge: 2024-02-01 | Disposition: A | Discharge status: Home / Self Care | Source: Ambulatory Visit | Attending: Cardiology | Admitting: Cardiology

## 2024-02-01 DIAGNOSIS — I350 Nonrheumatic aortic (valve) stenosis: Secondary | ICD-10-CM | POA: Diagnosis not present

## 2024-02-01 LAB — ECHOCARDIOGRAM COMPLETE
AR max vel: 1.38 cm2
AV Area VTI: 1.43 cm2
AV Area mean vel: 1.29 cm2
AV Mean grad: 25.2 mmHg
AV Peak grad: 41.9 mmHg
Ao pk vel: 3.24 m/s
Area-P 1/2: 3.56 cm2
S' Lateral: 2.7 cm

## 2024-02-03 ENCOUNTER — Ambulatory Visit: Payer: Self-pay | Admitting: Cardiology

## 2024-02-03 NOTE — Telephone Encounter (Signed)
-----   Message from Debria Fang sent at 02/03/2024  3:59 PM EDT ----- Please tell patient that his echocardiogram showed moderate aortic valve stenosis (thickening of the aortic valve). This appears to be stable compared to echocardiogram from 07/2023. There is no leaking of the aortic valve noted. We will continue to monitor aortic valve with repeat echos over time.   Echo also showed normal EF (pump function of the heart) at 60-65%. There were no regional wall motion abnormalities. RV functions normally.   No changes to treatment plan at this time.  Thanks [employer]

## 2024-02-03 NOTE — Telephone Encounter (Signed)
 Left message to call back

## 2024-02-03 NOTE — Telephone Encounter (Signed)
 Called patient advised of below they verbalized understanding.

## 2024-02-14 DIAGNOSIS — H524 Presbyopia: Secondary | ICD-10-CM | POA: Diagnosis not present

## 2024-03-30 ENCOUNTER — Encounter: Payer: Self-pay | Admitting: Family Medicine

## 2024-04-06 ENCOUNTER — Encounter: Payer: Self-pay | Admitting: Family Medicine

## 2024-04-06 ENCOUNTER — Ambulatory Visit: Admitting: Family Medicine

## 2024-04-06 VITALS — BP 128/80 | HR 96 | Wt 198.6 lb

## 2024-04-06 DIAGNOSIS — G5603 Carpal tunnel syndrome, bilateral upper limbs: Secondary | ICD-10-CM | POA: Diagnosis not present

## 2024-04-06 NOTE — Progress Notes (Signed)
   Subjective:    Patient ID: Rodney Wade, male    DOB: November 26, 1955, 68 y.o.   MRN: [id number]  HPI He has a several month history of difficulty with bilateral hand tingling.  He usually wakes him up in the middle of the night and when he shakes his hands it usually goes away relatively quickly.  He cannot tell me where on the fingers it bothers him.   Review of Systems     Objective:    Physical Exam Exam of both wrist shows a negative Tinel's but positive Phalen's test.  Strength is normal.  Decreased sensation when doing the Phalen's test in the median nerve distribution.       Assessment & Plan:  Bilateral carpal tunnel syndrome Information concerning carpal tunnel was given to him and also recommend he use night splints.  Discussed the possible use of an NSAID.  Also discussed possible injection if no improvement with the night splints.

## 2024-04-06 NOTE — Patient Instructions (Signed)
 Pinched Nerve in the Wrist (Carpal Tunnel Syndrome): What to Know  Pinched nerve in the wrist (carpal tunnel syndrome, or CTS) is a nerve problem that causes pain, numbness, and weakness in the wrist, hand, and fingers. The carpal tunnel is a narrow space that is on the palm side of your wrist. Repeated wrist motions or certain diseases may cause swelling in the tunnel. This swelling can pinch the main nerve in the wrist (the median nerve). What are the causes? CTS may be caused by: Moving your hand and wrist over and over again while doing a task. Hurting the wrist. Arthritis. A pocket of fluid (cyst) or a growth (tumor) in the carpal tunnel. Fluid buildup when you are pregnant. Use of tools that vibrate. In some cases, the cause of CTS is not known. What increases the risk? You're more likely to have CTS if: You have a job that makes you do these things: Move your hand firmly over and over again. Work with tools that vibrate, such as drills or sanders. You're male. You have diabetes, obesity, thyroid problems, or kidney failure. What are the signs or symptoms? Symptoms of this condition include: A tingling feeling in your fingers. You may feel this pain in the thumb, index finger, or middle finger. Tingling or loss of feeling in your hand. Pain in your entire arm. This pain may get worse when you bend your wrist and elbow for a long time. Pain in your wrist that goes up your arm to your shoulder. Pain that goes down into your palm or fingers. Weakness in your hands. You may find it hard to grab and hold items. Your symptoms may feel worse during the night. How is this diagnosed? CTS is diagnosed with a medical history and physical exam. Tests and imaging may also be done to: Check the electrical signals sent by your nerves into the muscles. Check how well electrical signals pass through your nerves. Check possible causes of your CTS. These include X-rays, ultrasound, and  MRI. How is this treated? CTS may be treated with: Lifestyle changes. You will be asked to stop or change the activity that caused your problem. Physical therapy. This may include: Exercises that stretch and strengthen the muscles and tendons in the wrist and hand. Nerve gliding or flossing exercises. These help keep nerves moving smoothly through the tissues around them. Occupational therapy. You'll learn how to use your hand again. Medicines for pain and swelling. You may have injections in your wrist. A wrist splint or brace. Surgery. Follow these instructions at home: If you have a splint or brace: Wear the splint or brace as told. Take it off only if your provider says you can. Check the skin around it every day. Tell your provider if you see problems. Loosen the splint or brace if your fingers tingle, are numb, or turn cold and blue. Keep the splint or brace clean and dry. If the splint or brace isn't waterproof: Do not let it get wet. Cover it when you take a bath or shower. Use a cover that doesn't let any water  in. Managing pain, stiffness, and swelling  Use ice or an ice pack as told. If you have a splint or brace that you can take off, remove it only as told. Place a towel between your skin and the ice. Leave the ice on for 20 minutes, 2-3 times a day. If your skin turns red, take off the ice right away to prevent skin damage. The risk  of damage is higher if you can't feel pain, heat, or cold. Move your fingers often to reduce stiffness and swelling. General instructions Take your medicines only as told. Rest your wrist and hand from activity that may cause pain. If your CTS is caused by things you do at work, talk with your employer about making changes. For example, you may need a wrist pad to use while typing. Exercise as told. Follow instructions on how to do nerve gliding or flossing exercises. These help keep nerves in moving smoothly through the tissues around  them. Keep all follow-up visits. This is important. Where to find more information American Academy of Orthopedic Surgeons: orthoinfo.aaos.[employer] of Neurological Disorders and Stroke: BasicFM.no Contact a health care provider if: You have new symptoms. Your pain is not controlled with medicines. Your symptoms get worse. Get help right away if: Your hand or wrist tingles or is numb, and the symptoms become very bad. This information is not intended to replace advice given to you by your health care provider. Make sure you discuss any questions you have with your health care provider. Using night splints and see if that will be enough to take care of it Document Revised: 07/13/2023 Document Reviewed: 04/30/2023 Elsevier Patient Education  2024 [employer].

## 2024-04-26 ENCOUNTER — Encounter: Payer: Self-pay | Admitting: Family Medicine

## 2024-04-26 MED ORDER — BENZONATATE 100 MG PO CAPS: 200.0000 mg | ORAL_CAPSULE | Freq: Three times a day (TID) | ORAL | 0 refills | Status: DC | PRN

## 2024-05-01 NOTE — Progress Notes (Signed)
 Cardiology Office Note   Date:  05/08/2024  ID:  Corneluis, [place]ston 26-Mar-1956, MRN [id number] PCP: Joyce Norleen BROCKS, MD  [facility] Providers Cardiologist:  Lurena MARLA Red, MD   History of Present Illness Rodney Wade is a 68 y.o. male with a past medical history of aortic valve stenosis, hypertension, migraines, PVCs.  Patient is followed by Dr. Alvan and presents today for an annual follow up appointment.    Per chart review, patient had previously undergone echocardiogram in 04/2019 that showed EF 60-65%, normal RV function mild aortic stenosis.  He was referred to Dr. Alvan in 07/2022 for evaluation of progressive AS murmur and new PVCs.  At that time, patient denied any limitations with physical activity, chest pain, or shortness of breath.  He underwent echocardiogram on 08/12/2022 that showed EF 60-65%, no regional wall motion abnormalities, grade 1 diastolic dysfunction, normal RV function, mild aortic valve regurgitation, moderate aortic valve stenosis.  Patient underwent nuclear stress test on 08/17/2022 that was a normal, low risk study without evidence of ischemia or past infarction.  I saw patient in clinic on 05/10/2023.  At that time, patient had been doing very well from a cardiac perspective.  Able to ride up to 7 miles on his exercise bike without chest pain or shortness of breath.  Ordered repeat echocardiogram completed in 01/2024 that showed EF 60 to 65%, no wall motion abnormalities, mild LVH, grade 1 DD, normal RV systolic function, moderate aortic valve stenosis.  Patient reports that he has been doing very well for the past year.  He is continue to stay active by using his exercise bike.  Reports being able to bike up to 6-7 miles easily.  Denies any chest pain, shortness of breath.  Denies dizziness, syncope, near syncope.  Denies any palpitations.  In the past he has been told he has PVCs but he is asymptomatic with this.  His blood pressure has  been well-controlled.  Follows closely with his PCP as well    Studies Reviewed Cardiac Studies & Procedures   ______________________________________________________________________________________________   STRESS TESTS  MYOCARDIAL PERFUSION IMAGING 08/17/2022  Interpretation Summary   The study is normal. The study is low risk.   No ST deviation was noted. The ECG was not diagnostic due to pharmacologic protocol.   LV perfusion is normal. There is no evidence of ischemia. There is no evidence of infarction.   Left ventricular function is normal. Nuclear stress EF: 59 %. The left ventricular ejection fraction is normal (55-65%). End diastolic cavity size is normal. End systolic cavity size is normal.   Prior study not available for comparison.   ECHOCARDIOGRAM  ECHOCARDIOGRAM COMPLETE 02/01/2024  Narrative ECHOCARDIOGRAM REPORT    Patient Name:   Rodney Wade Date of Exam: 02/01/2024 Medical Rec #:  [id number]               Height:       67.0 in Accession #:    [id number]              Weight:       180.0 lb Date of Birth:  August 16, 1956               BSA:          1.934 m Patient Age:    67 years                BP:           120/80  mmHg Patient Gender: M                       HR:           86 bpm. Exam Location:  [address]  Procedure: 2D Echo, Cardiac Doppler and Color Doppler (Both Spectral and Color Flow Doppler were utilized during procedure).  Indications:    I35.0 Aortic Stenosis  History:        Patient has prior history of Echocardiogram examinations, most recent 08/02/2023. CAD, Signs/Symptoms:Murmur; Risk Factors:Hypertension.  Sonographer:    Carl Coma RDCS Referring Phys: [id number] ROLLO JONELLE LOUDER  IMPRESSIONS   1. Left ventricular ejection fraction, by estimation, is 60 to 65%. The left ventricle has normal function. The left ventricle has no regional wall motion abnormalities. There is mild concentric left ventricular hypertrophy.  Left ventricular diastolic parameters are consistent with Grade I diastolic dysfunction (impaired relaxation). 2. Right ventricular systolic function is normal. The right ventricular size is normal. 3. Left atrial size was mildly dilated. 4. The mitral valve is normal in structure. No evidence of mitral valve regurgitation. No evidence of mitral stenosis. 5. The aortic valve is calcified. There is severe calcifcation of the aortic valve. There is moderate thickening of the aortic valve. Aortic valve regurgitation is not visualized. Moderate aortic valve stenosis. Aortic valve area, by VTI measures 1.43 cm. Aortic valve mean gradient measures 25.2 mmHg. 6. The inferior vena cava is normal in size with greater than 50% respiratory variability, suggesting right atrial pressure of 3 mmHg.  FINDINGS Left Ventricle: Left ventricular ejection fraction, by estimation, is 60 to 65%. The left ventricle has normal function. The left ventricle has no regional wall motion abnormalities. The left ventricular internal cavity size was normal in size. There is mild concentric left ventricular hypertrophy. Left ventricular diastolic parameters are consistent with Grade I diastolic dysfunction (impaired relaxation).  Right Ventricle: The right ventricular size is normal. No increase in right ventricular wall thickness. Right ventricular systolic function is normal.  Left Atrium: Left atrial size was mildly dilated.  Right Atrium: Right atrial size was normal in size.  Pericardium: There is no evidence of pericardial effusion.  Mitral Valve: The mitral valve is normal in structure. No evidence of mitral valve regurgitation. No evidence of mitral valve stenosis.  Tricuspid Valve: The tricuspid valve is normal in structure. Tricuspid valve regurgitation is not demonstrated. No evidence of tricuspid stenosis.  The aortic valve is calcified. There is severe calcifcation of the aortic valve. There is moderate  thickening of the aortic valve. There is moderate aortic valve annular calcification. Aortic valve regurgitation is not visualized. Moderate aortic stenosis is present. Pulmonic Valve: The pulmonic valve was normal in structure. Pulmonic valve regurgitation is not visualized. No evidence of pulmonic stenosis.  Aorta: The aortic root is normal in size and structure.  Venous: The inferior vena cava is normal in size with greater than 50% respiratory variability, suggesting right atrial pressure of 3 mmHg.  IAS/Shunts: No atrial level shunt detected by color flow Doppler.   LEFT VENTRICLE PLAX 2D LVIDd:         4.30 cm   Diastology LVIDs:         2.70 cm   LV e' medial:    8.98 cm/s LV PW:         1.10 cm   LV E/e' medial:  8.5 LV IVS:        1.10 cm   LV e'  lateral:   8.10 cm/s LVOT diam:     2.30 cm   LV E/e' lateral: 9.4 LV SV:         92 LV SV Index:   48 LVOT Area:     4.15 cm   RIGHT VENTRICLE             IVC RV Basal diam:  4.30 cm     IVC diam: 1.10 cm RV S prime:     18.03 cm/s TAPSE (M-mode): 2.6 cm  LEFT ATRIUM             Index        RIGHT ATRIUM           Index LA diam:        3.80 cm 1.97 cm/m   RA Area:     13.30 cm LA Vol (A2C):   80.8 ml 41.79 ml/m  RA Volume:   33.30 ml  17.22 ml/m LA Vol (A4C):   46.7 ml 24.15 ml/m LA Biplane Vol: 64.0 ml 33.10 ml/m AORTIC VALVE AV Area (Vmax):    1.38 cm AV Area (Vmean):   1.29 cm AV Area (VTI):     1.43 cm AV Vmax:           323.80 cm/s AV Vmean:          235.800 cm/s AV VTI:            0.643 m AV Peak Grad:      41.9 mmHg AV Mean Grad:      25.2 mmHg LVOT Vmax:         107.67 cm/s LVOT Vmean:        73.200 cm/s LVOT VTI:          0.221 m LVOT/AV VTI ratio: 0.34  AORTA Ao Root diam: 3.40 cm Ao Asc diam:  3.20 cm  MITRAL VALVE                TRICUSPID VALVE MV Area (PHT): 3.56 cm     TR Peak grad:   22.3 mmHg MV Decel Time: 213 msec     TR Vmax:        236.00 cm/s MV E velocity: 76.50 cm/s MV A  velocity: 109.50 cm/s  SHUNTS MV E/A ratio:  0.70         Systemic VTI:  0.22 m Systemic Diam: 2.30 cm  Kardie Tobb DO Electronically signed by Dub Huntsman DO Signature Date/Time: 02/01/2024/4:46:51 PM    Final          ______________________________________________________________________________________________       Risk Assessment/Calculations           Physical Exam VS:  BP 114/80 (BP Location: Left Arm, Patient Position: Sitting, Cuff Size: Large)   Pulse 95   Ht 5' 7 (1.702 m)   Wt 193 lb (87.5 kg)   SpO2 94%   BMI 30.23 kg/m        Wt Readings from Last 3 Encounters:  05/08/24 193 lb (87.5 kg)  04/06/24 198 lb 9.6 oz (90.1 kg)  11/16/23 180 lb (81.6 kg)    GEN: Well nourished, well developed in no acute distress. Sitting comfortably on the exam table in no acute distress  NECK: No JVD  CARDIAC:  RRR. Grade 2/6 systolic murmur at RUSB  RESPIRATORY:  Clear to auscultation without rales, wheezing or rhonchi. Normal WOB on room air   ABDOMEN: Soft, non-tender, non-distended EXTREMITIES:  No edema; No deformity  ASSESSMENT AND PLAN  Moderate aortic valve stenosis  - Echocardiogram 01/2024 showed moderate aortic valve stenosis with mean gradient 25.2 mmHg, no aortic regurgitation noted, no mitral valve regurgitation - Patient denies chest pain, shortness of breath, syncope or near syncope - Has grade 2/6 systolic murmur on exam - Ordered echocardiogram to be completed in 1 year for monitoring purposes  PVCs  - Echo in 01/2024 showed EF 60-65%, no wall motion abnormalities, normal RV systolic function - Stress test in 08/2022 was without ischemia or infarction  - Patient is asymptomatic and cannot feel PVCS - continue to monitor for now.  - Discussed PVC triggers including caffeine, stress, alcohol, dehydration    HTN - Continue amlodipine  10 mg daily, losartan -hydrochlorothiazide  100-12.5 mg daily  - BP well-controlled.  No symptoms of orthostatic  hypotension.  Labs followed by PCP  Dispo: Follow-up in 1 year Previously followed by Dr. Alvan. Will establish care with Dr. Wendel   Signed, Rollo FABIENE Louder, PA-C

## 2024-05-08 ENCOUNTER — Encounter: Payer: Self-pay | Admitting: Cardiology

## 2024-05-08 ENCOUNTER — Ambulatory Visit: Attending: Cardiology | Admitting: Cardiology

## 2024-05-08 VITALS — BP 114/80 | HR 95 | Ht 67.0 in | Wt 193.0 lb

## 2024-05-08 DIAGNOSIS — I493 Ventricular premature depolarization: Secondary | ICD-10-CM

## 2024-05-08 DIAGNOSIS — I10 Essential (primary) hypertension: Secondary | ICD-10-CM | POA: Diagnosis not present

## 2024-05-08 DIAGNOSIS — I350 Nonrheumatic aortic (valve) stenosis: Secondary | ICD-10-CM

## 2024-05-08 NOTE — Addendum Note (Signed)
 Addended by: BYRON LEONTINE RAMAN on: 05/08/2024 03:31 PM   Modules accepted: Orders

## 2024-05-08 NOTE — Patient Instructions (Signed)
 Medication Instructions:  No changes *If you need a refill on your cardiac medications before your next appointment, please call your pharmacy*  Lab Work: No labs  Testing/Procedures: Your physician has requested that you have an echocardiogram. Echocardiography is a painless test that uses sound waves to create images of your heart. It provides your doctor with information about the size and shape of your heart and how well your heart's chambers and valves are working. This procedure takes approximately one hour. There are no restrictions for this procedure. Please do NOT wear cologne, perfume, aftershave, or lotions (deodorant is allowed). Please arrive 15 minutes prior to your appointment time.  Please note: We ask at that you not bring children with you during ultrasound (echo/ vascular) testing. Due to room size and safety concerns, children are not allowed in the ultrasound rooms during exams. Our front office staff cannot provide observation of children in our lobby area while testing is being conducted. An adult accompanying a patient to their appointment will only be allowed in the ultrasound room at the discretion of the ultrasound technician under special circumstances. We apologize for any inconvenience.  Follow-Up: At [facility], you and your health needs are our priority.  As part of our continuing mission to provide you with exceptional heart care, our providers are all part of one team.  This team includes your primary Cardiologist (physician) and Advanced Practice Providers or APPs (Physician Assistants and Nurse Practitioners) who all work together to provide you with the care you need, when you need it.  Your next appointment:   1 year(s)  Provider:   Arun K Thukkani, MD

## 2024-05-17 ENCOUNTER — Other Ambulatory Visit: Payer: Self-pay | Admitting: Family Medicine

## 2024-05-17 DIAGNOSIS — I158 Other secondary hypertension: Secondary | ICD-10-CM

## 2024-07-18 ENCOUNTER — Encounter: Payer: Self-pay | Admitting: Family Medicine

## 2024-07-18 ENCOUNTER — Ambulatory Visit: Admitting: Family Medicine

## 2024-07-18 VITALS — BP 138/78 | HR 90 | Ht 68.0 in | Wt 202.0 lb

## 2024-07-18 DIAGNOSIS — F338 Other recurrent depressive disorders: Secondary | ICD-10-CM | POA: Diagnosis not present

## 2024-07-18 DIAGNOSIS — Z23 Encounter for immunization: Secondary | ICD-10-CM

## 2024-07-18 MED ORDER — VENLAFAXINE HCL ER 37.5 MG PO CP24: 37.5000 mg | ORAL_CAPSULE | Freq: Every day | ORAL | 0 refills | Status: DC

## 2024-07-18 NOTE — Progress Notes (Signed)
   Subjective:    Patient ID: Rodney Wade, male    DOB: 07-10-1956, 68 y.o.   MRN: [id number]  Discussed the use of AI scribe software for clinical note transcription with the patient, who gave verbal consent to proceed.  History of Present Illness   Rodney Wade is a 68 year old male who presents with concerns about memory issues and seasonal depression.  He experiences occasional memory lapses, such as repeating stories or forgetting whether he has shared certain information. These incidents are infrequent, but his daughter has noticed them, prompting her concern. He is aware of these lapses and has developed strategies to manage them, such as using sticky notes and associating items with tasks to remember them. A family history of dementia is present, as his father passed away with the condition, which adds to his concern. He notes no other cognitive issues.  His work continues to go well and he has not noted any issues with that. He discusses his experience with seasonal affective disorder (SAD), noting a dislike for the early onset of darkness. He has tried various treatments in the past, including light therapy and several SSRIs like Prozac and Celexa , which he could not tolerate due to side effects such as anorgasmia. He also tried Wellbutrin  but discontinued it after three days due to severe anxiety and jitteriness. He engages in social activities, such as trivia nights and gatherings with friends, to help manage his mood during the winter months.           Review of Systems     Objective:    Physical Exam Alert and in no distress.  MMSE is normal.             Assessment & Plan:  Assessment and Plan    Depression, recurrent, with seasonal pattern (Seasonal Affective Disorder) Recurrent depression with seasonal exacerbation. SSRIs caused anorgasmia, Wellbutrin  caused anxiety, light therapy ineffective. Effexor considered for its SNRI profile. -  Initiated Effexor 37.5 mg daily. - Encouraged regular engagement in enjoyable activities.  General Health Maintenance Routine health maintenance discussed, including recent flu vaccination and upcoming COVID-19 vaccination. - Continue routine health maintenance.     He will return here in January for complete examination.

## 2024-10-10 ENCOUNTER — Encounter: Payer: Self-pay | Admitting: Family Medicine

## 2024-10-10 ENCOUNTER — Ambulatory Visit: Payer: Medicare Other | Admitting: Family Medicine

## 2024-10-10 VITALS — BP 130/70 | HR 88 | Ht 67.0 in | Wt 201.0 lb

## 2024-10-10 DIAGNOSIS — Z13220 Encounter for screening for lipoid disorders: Secondary | ICD-10-CM

## 2024-10-10 DIAGNOSIS — F341 Dysthymic disorder: Secondary | ICD-10-CM

## 2024-10-10 DIAGNOSIS — Z96652 Presence of left artificial knee joint: Secondary | ICD-10-CM | POA: Diagnosis not present

## 2024-10-10 DIAGNOSIS — I10 Essential (primary) hypertension: Secondary | ICD-10-CM

## 2024-10-10 DIAGNOSIS — Z8669 Personal history of other diseases of the nervous system and sense organs: Secondary | ICD-10-CM

## 2024-10-10 DIAGNOSIS — Z0000 Encounter for general adult medical examination without abnormal findings: Secondary | ICD-10-CM | POA: Diagnosis not present

## 2024-10-10 DIAGNOSIS — Z860101 Personal history of adenomatous and serrated colon polyps: Secondary | ICD-10-CM | POA: Diagnosis not present

## 2024-10-10 DIAGNOSIS — L821 Other seborrheic keratosis: Secondary | ICD-10-CM | POA: Diagnosis not present

## 2024-10-10 DIAGNOSIS — I350 Nonrheumatic aortic (valve) stenosis: Secondary | ICD-10-CM | POA: Diagnosis not present

## 2024-10-10 DIAGNOSIS — L570 Actinic keratosis: Secondary | ICD-10-CM

## 2024-10-10 LAB — LIPID PANEL
Chol/HDL Ratio: 2.2 ratio (ref 0.0–5.0)
Cholesterol, Total: 200 mg/dL — ABNORMAL HIGH (ref 100–199)
HDL: 92 mg/dL
LDL Chol Calc (NIH): 79 mg/dL (ref 0–99)
Triglycerides: 181 mg/dL — ABNORMAL HIGH (ref 0–149)
VLDL Cholesterol Cal: 29 mg/dL (ref 5–40)

## 2024-10-10 LAB — CBC WITH DIFFERENTIAL/PLATELET
Basophils Absolute: 0.1 10*3/uL (ref 0.0–0.2)
Basos: 1 %
EOS (ABSOLUTE): 0.1 10*3/uL (ref 0.0–0.4)
Eos: 1 %
Hematocrit: 42.7 % (ref 37.5–51.0)
Hemoglobin: 14.6 g/dL (ref 13.0–17.7)
Immature Grans (Abs): 0 10*3/uL (ref 0.0–0.1)
Immature Granulocytes: 0 %
Lymphocytes Absolute: 2.7 10*3/uL (ref 0.7–3.1)
Lymphs: 44 %
MCH: 33.3 pg — ABNORMAL HIGH (ref 26.6–33.0)
MCHC: 34.2 g/dL (ref 31.5–35.7)
MCV: 97 fL (ref 79–97)
Monocytes Absolute: 0.5 10*3/uL (ref 0.1–0.9)
Monocytes: 7 %
Neutrophils Absolute: 3 10*3/uL (ref 1.4–7.0)
Neutrophils: 47 %
Platelets: 292 10*3/uL (ref 150–450)
RBC: 4.39 x10E6/uL (ref 4.14–5.80)
RDW: 12.7 % (ref 11.6–15.4)
WBC: 6.3 10*3/uL (ref 3.4–10.8)

## 2024-10-10 LAB — COMPREHENSIVE METABOLIC PANEL WITH GFR
ALT: 29 [IU]/L (ref 0–44)
AST: 40 [IU]/L (ref 0–40)
Albumin: 4.6 g/dL (ref 3.9–4.9)
Alkaline Phosphatase: 59 [IU]/L (ref 47–123)
BUN/Creatinine Ratio: 13 (ref 10–24)
BUN: 12 mg/dL (ref 8–27)
Bilirubin Total: 0.3 mg/dL (ref 0.0–1.2)
CO2: 21 mmol/L (ref 20–29)
Calcium: 9.2 mg/dL (ref 8.6–10.2)
Chloride: 101 mmol/L (ref 96–106)
Creatinine, Ser: 0.94 mg/dL (ref 0.76–1.27)
Globulin, Total: 2.6 g/dL (ref 1.5–4.5)
Glucose: 158 mg/dL — ABNORMAL HIGH (ref 70–99)
Potassium: 4.2 mmol/L (ref 3.5–5.2)
Sodium: 141 mmol/L (ref 134–144)
Total Protein: 7.2 g/dL (ref 6.0–8.5)
eGFR: 87 mL/min/{1.73_m2}

## 2024-10-10 MED ORDER — AMLODIPINE BESYLATE 10 MG PO TABS: 10.0000 mg | ORAL_TABLET | Freq: Every day | ORAL | 3 refills | Status: AC

## 2024-10-10 MED ORDER — LOSARTAN POTASSIUM-HCTZ 100-12.5 MG PO TABS: 1.0000 | ORAL_TABLET | Freq: Every day | ORAL | 1 refills | Status: AC

## 2024-10-10 MED ORDER — ROSUVASTATIN CALCIUM 10 MG PO TABS: 10.0000 mg | ORAL_TABLET | Freq: Every day | ORAL | 3 refills | Status: AC

## 2024-10-10 NOTE — Progress Notes (Signed)
 "     Subjective:   Rodney Wade is a 69 y.o. male who presents for a Medicare Annual Wellness Visit,CPE. Discussed the use of AI scribe software for clinical note transcription with the patient, who gave verbal consent to proceed.  He has worsening tremors that are impacting his daily activities. He previously tried Effexor  for four days but discontinued it due to orgasmic issues.  He did have some anxiety and early depression in the past however is handling this quite well and is not having trouble at the present time.  He mentions a spot on his back that he can feel but not see. He also has skin changes on his scalp.  He has a history of knee replacement and reports ongoing stiffness, tightness, and numbness in the area. He experiences pain in the IT band after walking half a mile, which he is addressing with physical therapy.  He is currently taking losartan  and amlodipine  for hypertension, which is well-controlled. He previously experienced an ACE inhibitor cough and edema, leading to a change in medication. He has a history of colonic polyps and is getting regular follow-up on that. He has a history of aortic stenosis, which is being monitored regularly. He also has a history of colonic polyps and is on a regular colonoscopy schedule, with the last one performed in March 2025.  No issues with migraines, falls, or depression. He is active in his church and maintains a long-distance relationship with his partner in [place] . He is considering retirement and exploring ways to stay socially and intellectually engaged.      Visit info / Clinical Intake: Medicare Wellness Visit Type:: Subsequent Annual Wellness Visit Persons participating in visit and providing information:: patient Medicare Wellness Visit Mode:: In-person (required for WTM) Interpreter Needed?: No Pre-visit prep was completed: no AWV questionnaire completed by patient prior to visit?: no Living arrangements::  (!) lives alone Patient's Overall Health Status Rating: very good Typical amount of pain: none Does pain affect daily life?: no Are you currently prescribed opioids?: no  Dietary Habits and Nutritional Risks How many meals a day?: 3 Eats fruit and vegetables daily?: yes Most meals are obtained by: eating out; preparing own meals In the last 2 weeks, have you had any of the following?: none  Functional Status Activities of Daily Living (to include ambulation/medication): Independent Ambulation: Independent Medication Administration: Independent Home Management (perform basic housework or laundry): Independent Manage your own finances?: yes Primary transportation is: driving Concerns about vision?: no *vision screening is required for WTM* Concerns about hearing?: no  Fall Screening Falls in the past year?: 0 Number of falls in past year: 0 Was there an injury with Fall?: 0 Fall Risk Category Calculator: 0 Patient Fall Risk Level: Low Fall Risk  Fall Risk Patient at Risk for Falls Due to: No Fall Risks Fall risk Follow up: Falls evaluation completed  Home and Transportation Safety: All rugs have non-skid backing?: (!) no All stairs or steps have railings?: yes Grab bars in the bathtub or shower?: (!) no Have non-skid surface in bathtub or shower?: (!) no Good home lighting?: yes Regular seat belt use?: yes Hospital stays in the last year:: no  Cognitive Assessment Difficulty concentrating, remembering, or making decisions? : no Will 6CIT or Mini Cog be Completed: yes Was the patient able to repeat memory words in 3 tries?: yes Which version was used?: Version 3 : village, kitchen, baby Clock numbers correct?: yes Clock time correct (11:10)?: yes (2:00) Normal  clock drawing test?: 2 How many words correct?: 3 Which version was used?: Version 3: village kitchen baby Mini-Cog Scoring: 5  Advance Directives (For Healthcare) Does Patient Have a Medical Advance  Directive?: Yes  Reviewed/Updated  Reviewed/Updated: Patient Goals    Allergies (verified) Ace inhibitors   Current Medications (verified) Outpatient Encounter Medications as of 10/10/2024  Medication Sig   ALPRAZolam  (XANAX ) 0.5 MG tablet Take 1 tablet (0.5 mg total) by mouth 2 (two) times daily as needed for anxiety.   meloxicam (MOBIC) 15 MG tablet Take 15 mg by mouth daily.   Multiple Vitamin (MULTIVITAMIN WITH MINERALS) TABS tablet Take 1 tablet by mouth daily.   rosuvastatin  (CRESTOR ) 10 MG tablet Take 1 tablet (10 mg total) by mouth daily.   amLODipine  (NORVASC ) 10 MG tablet Take 1 tablet (10 mg total) by mouth daily.   losartan -hydrochlorothiazide  (HYZAAR) 100-12.5 MG tablet Take 1 tablet by mouth daily.   [DISCONTINUED] amLODipine  (NORVASC ) 10 MG tablet Take 1 tablet (10 mg total) by mouth daily.   [DISCONTINUED] benzonatate  (TESSALON ) 100 MG capsule Take 2 capsules (200 mg total) by mouth 3 (three) times daily as needed for cough.   [DISCONTINUED] losartan -hydrochlorothiazide  (HYZAAR) 100-12.5 MG tablet TAKE 1 TABLET BY MOUTH DAILY   [DISCONTINUED] venlafaxine  XR (EFFEXOR  XR) 37.5 MG 24 hr capsule Take 1 capsule (37.5 mg total) by mouth daily with breakfast.   Facility-Administered Encounter Medications as of 10/10/2024  Medication   0.9 %  sodium chloride  infusion    History: Past Medical History:  Diagnosis Date   Anxiety    Arthritis    Coronary artery disease    Dysrhythmia    Gout    Heart murmur    HH (hiatus hernia)    History of benign essential tremor    Hypertension    Migraine headache    Past Surgical History:  Procedure Laterality Date   bone spur removal Right 2006   COLONOSCOPY     GANGLION CYST EXCISION     PARTIAL KNEE ARTHROPLASTY Left 10/05/2022   Procedure: Left knee medial unicompartmental arthroplasty;  Surgeon: Melodi Lerner, MD;  Location: [facility];  Service: Orthopedics;  Laterality: Left;   Family History  Problem Relation Age of  Onset   Hypertension Mother    Hypertension Father    Colon cancer Neg Hx    Allergic rhinitis Neg Hx    Angioedema Neg Hx    Asthma Neg Hx    Eczema Neg Hx    Colon polyps Neg Hx    Stomach cancer Neg Hx    Esophageal cancer Neg Hx    Rectal cancer Neg Hx    Social History   Occupational History   Not on file  Tobacco Use   Smoking status: Never   Smokeless tobacco: Never  Vaping Use   Vaping status: Never Used  Substance and Sexual Activity   Alcohol use: Yes    Alcohol/week: 4.0 standard drinks of alcohol    Types: 4 Glasses of wine per week    Comment: occasional   Drug use: No   Sexual activity: Yes   Tobacco Counseling Counseling given: Not Answered  SDOH Screenings   Food Insecurity: No Food Insecurity (07/14/2024)  Housing: Unknown (07/14/2024)  Transportation Needs: No Transportation Needs (07/14/2024)  Utilities: Not At Risk (10/06/2022)  Alcohol Screen: Low Risk (07/14/2024)  Depression (PHQ2-9): Low Risk (10/10/2024)  Financial Resource Strain: Low Risk (07/14/2024)  Physical Activity: Insufficiently Active (07/14/2024)  Social Connections: Moderately Integrated (07/14/2024)  Stress: No Stress Concern Present (07/14/2024)  Tobacco Use: Low Risk (10/10/2024)   See flowsheets for full screening details  Depression Screen PHQ 2 & 9 Depression Scale- Over the past 2 weeks, how often have you been bothered by any of the following problems? Little interest or pleasure in doing things: 0 Feeling down, depressed, or hopeless (PHQ Adolescent also includes...irritable): 0 PHQ-2 Total Score: 0 Trouble falling or staying asleep, or sleeping too much: 0 Feeling tired or having little energy: 0 Poor appetite or overeating (PHQ Adolescent also includes...weight loss): 0 Feeling bad about yourself - or that you are a failure or have let yourself or your family down: 0 Trouble concentrating on things, such as reading the newspaper or watching television (PHQ  Adolescent also includes...like school work): 0 Moving or speaking so slowly that other people could have noticed. Or the opposite - being so fidgety or restless that you have been moving around a lot more than usual: 0 Thoughts that you would be better off dead, or of hurting yourself in some way: 0 PHQ-9 Total Score: 0 If you checked off any problems, how difficult have these problems made it for you to do your work, take care of things at home, or get along with other people?: Not difficult at all     Goals Addressed             This Visit's Progress    Exercise 3x per week (30 min per time)       Exercise more this year.              Objective:    Alert and in no distress. Tympanic membranes and canals are normal. Pharyngeal area is normal. Neck is supple without adenopathy or thyromegaly. Cardiac exam shows a regular sinus rhythm with a grade 4 systolic murmur. Lungs are clear to auscultation. Exam of the scalp does show some actinic lesions present.  He also has seborrheic keratosis lesions on his back. Hearing/Vision screen No results found. Immunizations and Health Maintenance Health Maintenance  Topic Date Due   COVID-19 Vaccine (6 - 2025-26 season) 10/26/2024 (Originally 05/15/2024)   Medicare Annual Wellness (AWV)  10/10/2025   Colonoscopy  11/15/2028   DTaP/Tdap/Td (3 - Td or Tdap) 07/03/2029   Pneumococcal Vaccine: 50+ Years  Completed   Influenza Vaccine  Completed   Hepatitis C Screening  Completed   Zoster Vaccines- Shingrix   Completed   Meningococcal B Vaccine  Aged Out    The 10-year ASCVD risk score (Arnett DK, et al., 2019) is: 17.4%   Values used to calculate the score:     Age: 71 years     Clinically relevant sex: Male     Is Non-Hispanic African American: No     Diabetic: No     Tobacco smoker: No     Systolic Blood Pressure: 130 mmHg     Is BP treated: Yes     HDL Cholesterol: 67 mg/dL     Total Cholesterol: 169 mg/dL     Assessment/Plan:      Adult Wellness Visit Annual wellness visit conducted. Cardiovascular risk assessment indicates a 17.4% risk of heart attack over 10 years, warranting cholesterol management. - Renewed Norvasc  and Hyzaar prescriptions. - Ordered cardiovascular risk assessment. - Started Crestor  for cholesterol management.  Primary hypertension Hypertension is well-controlled with losartan  and amlodipine . - Continue losartan  and amlodipine .  Aortic stenosis Monitored regularly.  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 IT band syndrome causing pain after walking half a mile. - Continue physical therapy for IT band stretching for four weeks.  Actinic keratosis of scalp Presence of actinic keratosis on the scalp, likely precancerous changes due to sun exposure. - Referred to dermatologist for evaluation and management.  History of adenomatous colonic polyps Regular colonoscopy follow-up is necessary. Last colonoscopy was in March 2025, with a recommended follow-up in five years. - Scheduled follow-up colonoscopy in 2030.  Dysthymia Previously noted dysthymia, but currently psychological status is stable.     Hyperlipidemia. -I reviewed the ASCVD risk assessment on him and he is over 17%.  Recommended starting him on Crestor  which I will do.  Discussed possible side effects of the medication. Patient Care Team: Joyce Norleen BROCKS, MD as PCP - General (Family Medicine) Thukkani, Arun K, MD as PCP - Cardiology (Cardiology) Group, [facility] Pyrtle, Gordy HERO, MD as Consulting Physician (Gastroenterology) Melodi Lerner, MD as Consulting Physician (Orthopedic Surgery)  I have personally reviewed and noted the following in the patients chart:   Medical and social history Use of alcohol, tobacco or illicit drugs  Current medications and supplements including opioid prescriptions. Functional ability and status Nutritional  status Physical activity Advanced directives List of other physicians Hospitalizations, surgeries, and ER visits in previous 12 months Vitals Screenings to include cognitive, depression, and falls Referrals and appointments   In addition, I have reviewed and discussed with patient certain preventive protocols, quality metrics, and best practice recommendations. A written personalized care plan for preventive services as well as general preventive health recommendations were provided to patient.   Norleen Joyce, MD   10/10/2024   Return in about 1 year (around 10/10/2025).  After Visit Summary: (In Person-Printed) AVS printed and given to the patient  Nurse Notes: none  "

## 2024-10-11 ENCOUNTER — Ambulatory Visit: Payer: Self-pay | Admitting: Family Medicine

## 2024-10-11 ENCOUNTER — Other Ambulatory Visit: Payer: Self-pay

## 2024-10-11 LAB — HGB A1C W/O EAG: Hgb A1c MFr Bld: 5.5 % (ref 4.8–5.6)

## 2024-10-11 LAB — SPECIMEN STATUS REPORT

## 2024-10-11 NOTE — Progress Notes (Signed)
 I asked Rodney Wade to add this on to labs.

## 2024-10-14 ENCOUNTER — Other Ambulatory Visit: Payer: Self-pay | Admitting: Family Medicine

## 2024-10-14 DIAGNOSIS — F338 Other recurrent depressive disorders: Secondary | ICD-10-CM

## 2025-10-15 ENCOUNTER — Encounter: Admitting: Family Medicine
